# Patient Record
Sex: Male | Born: 1980 | Race: White | Hispanic: No | Marital: Married | Smoking: Never smoker
Health system: Southern US, Community
[De-identification: ages and names within clinical notes are randomized; demographics above are authoritative.]

## PROBLEM LIST (undated history)

## (undated) DIAGNOSIS — T63441A Toxic effect of venom of bees, accidental (unintentional), initial encounter: Secondary | ICD-10-CM

## (undated) HISTORY — PX: NO PAST SURGERIES: SHX2092

## (undated) HISTORY — DX: Toxic effect of venom of bees, accidental (unintentional), initial encounter: T63.441A

---

## 2015-08-21 ENCOUNTER — Encounter (HOSPITAL_BASED_OUTPATIENT_CLINIC_OR_DEPARTMENT_OTHER): Payer: Self-pay | Admitting: *Deleted

## 2015-08-21 ENCOUNTER — Emergency Department (HOSPITAL_BASED_OUTPATIENT_CLINIC_OR_DEPARTMENT_OTHER)
Admission: EM | Admit: 2015-08-21 | Discharge: 2015-08-21 | Disposition: A | Discharge status: Home / Self Care | Payer: Managed Care, Other (non HMO) | Attending: Emergency Medicine | Admitting: Emergency Medicine

## 2015-08-21 ENCOUNTER — Emergency Department (HOSPITAL_BASED_OUTPATIENT_CLINIC_OR_DEPARTMENT_OTHER): Payer: Managed Care, Other (non HMO)

## 2015-08-21 DIAGNOSIS — I10 Essential (primary) hypertension: Secondary | ICD-10-CM

## 2015-08-21 DIAGNOSIS — W270XXA Contact with workbench tool, initial encounter: Secondary | ICD-10-CM | POA: Diagnosis not present

## 2015-08-21 DIAGNOSIS — S61200A Unspecified open wound of right index finger without damage to nail, initial encounter: Secondary | ICD-10-CM | POA: Diagnosis not present

## 2015-08-21 DIAGNOSIS — Y9289 Other specified places as the place of occurrence of the external cause: Secondary | ICD-10-CM | POA: Diagnosis not present

## 2015-08-21 DIAGNOSIS — S6991XA Unspecified injury of right wrist, hand and finger(s), initial encounter: Secondary | ICD-10-CM | POA: Diagnosis present

## 2015-08-21 DIAGNOSIS — Y9389 Activity, other specified: Secondary | ICD-10-CM | POA: Insufficient documentation

## 2015-08-21 DIAGNOSIS — Y998 Other external cause status: Secondary | ICD-10-CM | POA: Diagnosis not present

## 2015-08-21 DIAGNOSIS — T148XXA Other injury of unspecified body region, initial encounter: Secondary | ICD-10-CM

## 2015-08-21 MED ORDER — TRAMADOL HCL 50 MG PO TABS: 50.0000 mg | ORAL_TABLET | Freq: Four times a day (QID) | ORAL | Status: DC | PRN

## 2015-08-21 MED FILL — traMADol HCL 50 MG TABS: 50 | 4 days supply | Qty: 15 | Fill #0

## 2015-08-21 NOTE — ED Notes (Signed)
Per PA's request, patient soaking finger in sterile water, peroxide, & iodine mixture.

## 2015-08-21 NOTE — ED Notes (Signed)
Patient ambulates to and from radiology department. 

## 2015-08-21 NOTE — Discharge Instructions (Signed)
Hypertension Hypertension, commonly called high blood pressure, is when the force of blood pumping through your arteries is too strong. Your arteries are the blood vessels that carry blood from your heart throughout your body. A blood pressure reading consists of a higher number over a lower number, such as 110/72. The higher number (systolic) is the pressure inside your arteries when your heart pumps. The lower number (diastolic) is the pressure inside your arteries when your heart relaxes. Ideally you want your blood pressure below 120/80. Hypertension forces your heart to work harder to pump blood. Your arteries may become narrow or stiff. Having untreated or uncontrolled hypertension can cause heart attack, stroke, kidney disease, and other problems. RISK FACTORS Some risk factors for high blood pressure are controllable. Others are not.  Risk factors you cannot control include:   Race. You may be at higher risk if you are African American.  Age. Risk increases with age.  Gender. Men are at higher risk than women before age 45 years. After age 65, women are at higher risk than men. Risk factors you can control include:  Not getting enough exercise or physical activity.  Being overweight.  Getting too much fat, sugar, calories, or salt in your diet.  Drinking too much alcohol. SIGNS AND SYMPTOMS Hypertension does not usually cause signs or symptoms. Extremely high blood pressure (hypertensive crisis) may cause headache, anxiety, shortness of breath, and nosebleed. DIAGNOSIS To check if you have hypertension, your health care provider will measure your blood pressure while you are seated, with your arm held at the level of your heart. It should be measured at least twice using the same arm. Certain conditions can cause a difference in blood pressure between your right and left arms. A blood pressure reading that is higher than normal on one occasion does not mean that you need treatment. If  it is not clear whether you have high blood pressure, you may be asked to return on a different day to have your blood pressure checked again. Or, you may be asked to monitor your blood pressure at home for 1 or more weeks. TREATMENT Treating high blood pressure includes making lifestyle changes and possibly taking medicine. Living a healthy lifestyle can help lower high blood pressure. You may need to change some of your habits. Lifestyle changes may include:  Following the DASH diet. This diet is high in fruits, vegetables, and whole grains. It is low in salt, red meat, and added sugars.  Keep your sodium intake below 2,300 mg per day.  Getting at least 30-45 minutes of aerobic exercise at least 4 times per week.  Losing weight if necessary.  Not smoking.  Limiting alcoholic beverages.  Learning ways to reduce stress. Your health care provider may prescribe medicine if lifestyle changes are not enough to get your blood pressure under control, and if one of the following is true:  You are 18-59 years of age and your systolic blood pressure is above 140.  You are 60 years of age or older, and your systolic blood pressure is above 150.  Your diastolic blood pressure is above 90.  You have diabetes, and your systolic blood pressure is over 140 or your diastolic blood pressure is over 90.  You have kidney disease and your blood pressure is above 140/90.  You have heart disease and your blood pressure is above 140/90. Your personal target blood pressure may vary depending on your medical conditions, your age, and other factors. HOME CARE INSTRUCTIONS    Have your blood pressure rechecked as directed by your health care provider.   Take medicines only as directed by your health care provider. Follow the directions carefully. Blood pressure medicines must be taken as prescribed. The medicine does not work as well when you skip doses. Skipping doses also puts you at risk for  problems.  Do not smoke.   Monitor your blood pressure at home as directed by your health care provider. SEEK MEDICAL CARE IF:   You think you are having a reaction to medicines taken.  You have recurrent headaches or feel dizzy.  You have swelling in your ankles.  You have trouble with your vision. SEEK IMMEDIATE MEDICAL CARE IF:  You develop a severe headache or confusion.  You have unusual weakness, numbness, or feel faint.  You have severe chest or abdominal pain.  You vomit repeatedly.  You have trouble breathing. MAKE SURE YOU:   Understand these instructions.  Will watch your condition.  Will get help right away if you are not doing well or get worse.   This information is not intended to replace advice given to you by your health care provider. Make sure you discuss any questions you have with your health care provider.   Document Released: 04/19/2005 Document Revised: 09/03/2014 Document Reviewed: 02/09/2013 Elsevier Interactive Patient Education 2016 [place]. Deep Skin Avulsion A deep skin avulsion is a type of open wound. It often results from a severe injury (trauma) that tears away all layers of the skin or an entire body part. The areas of the body that are most often affected by a deep skin avulsion include the face, lips, ears, nose, and fingers. A deep skin avulsion may make structures below the skin become visible. You may be able to see muscle, bone, nerves, and blood vessels. A deep skin avulsion can also damage important structures beneath the skin. These include tendons, ligaments, nerves, or blood vessels. CAUSES Injuries that often cause a deep skin avulsion include:  Being crushed.  Falling against a jagged surface.  Animal bites.  Gunshot wounds.  Severe burns.  Injuries that involve being dragged, such as bicycle or motorcycle accidents. SYMPTOMS Symptoms of a deep skin avulsion include:  Pain.  Numbness.  Swelling.  A  misshapen body part.  Bleeding, which may be heavy.  Fluid leaking from the wound. DIAGNOSIS This condition may be diagnosed with a medical history and physical exam. You may also have X-rays done. TREATMENT The treatment that is chosen for a deep skin avulsion depends on how large and deep the wound is and where it is located. Treatment for all types of avulsions usually starts with:  Controlling the bleeding.  Washing out the wound with a germ-free (sterile) salt-water solution.  Removing dead tissue from the wound. A wound may be closed or left open to heal. This depends on the size and location of the wound and whether it is likely to become infected. Wounds are usually covered or closed if they expose blood vessels, nerves, bone, or cartilage.  Wounds that are small and clean may be closed with stitches (sutures).  Wounds that cannot be closed with sutures may be covered with a piece of skin (graft) or a skin flap. Skin may be taken from on or near the wound, from another part of the body, or from a donor.  Wounds may be left open if they are hard to close or they may become infected. These wounds heal over time from the bottom  up. You may also receive medicine. This may include:  Antibiotics.  A tetanus shot.  Rabies vaccine. HOME CARE INSTRUCTIONS Medicines  Take or apply over-the-counter and prescription medicines only as told by your health care provider.  If you were prescribed an antibiotic, take or apply it as told by your health care provider. Do not stop taking the antibiotic even if your condition improves.  You may get anti-itch medicine while your wound is healing. Use it only as told by your health care provider. Wound Care  There are many ways to close and cover a wound. For example, a wound can be covered with sutures, skin glue, or adhesive strips. Follow instructions from your health care provider about:  How to take care of your wound.  When and how  you should change your bandage (dressing).  When you should remove your dressing.  Removing whatever was used to close your wound.  Keep the dressing dry as told by your health care provider. Do not take baths, swim, use a hot tub, or do anything that would put your wound underwater until your health care provider approves.  Clean the wound each day or as told by your health care provider.  Wash the wound with mild soap and water.  Rinse the wound with water to remove all soap.  Pat the wound dry with a clean towel. Do not rub it.  Do not scratch or pick at the wound.  Check your wound every day for signs of infection. Watch for:  Redness, swelling, or pain.  Fluid, blood, or pus. General Instructions  Raise (elevate) the injured area above the level of your heart while you are sitting or lying down.  Keep all follow-up visits as told by your health care provider. This is important. SEEK MEDICAL CARE IF:  You received a tetanus shot and you have swelling, severe pain, redness, or bleeding at the injection site.  You have a fever.  Your pain is not controlled with medicine.  You have increased redness, swelling, or pain at the site of your wound.  You have fluid, blood, or pus coming from your wound.  You notice a bad smell coming from your wound or your dressing.  A wound that was closed breaks open.  You notice something coming out of the wound, such as wood or glass.  You notice a change in the color of your skin near your wound.  You develop a new rash.  You need to change the dressing frequently due to fluid, blood, or pus draining from the wound. SEEK IMMEDIATE MEDICAL CARE IF:  Your pain suddenly increases and is severe.  You develop severe swelling around the wound.  You develop numbness around the wound.  You have nausea and vomiting that does not go away after 24 hours.  You feel light-headed, weak, or faint.  You develop chest pain.  You have  trouble breathing.  Your wound is on your hand or foot and you cannot properly move a finger or toe.  The wound is on your hand or foot and you notice that your fingers or toes look pale or bluish.  You have a red streak going away from your wound.   This information is not intended to replace advice given to you by your health care provider. Make sure you discuss any questions you have with your health care provider.   Document Released: 06/15/2006 Document Revised: 09/03/2014 Document Reviewed: 04/24/2014 Elsevier Interactive Patient Education [employer].

## 2015-08-21 NOTE — ED Notes (Signed)
Laceration to his right 2nd digit. Bleeding controlled.

## 2015-08-21 NOTE — ED Notes (Signed)
PA at bedside.

## 2015-08-21 NOTE — ED Provider Notes (Signed)
CSN: [id number]     Arrival date & time 08/21/15  1521 History   First MD Initiated Contact with Patient 08/21/15 1558     Chief Complaint  Patient presents with  . Laceration     (Consider location/radiation/quality/duration/timing/severity/associated sxs/prior Treatment) HPI  This is a 35 year old male who presents emergency Department with chief complaint of laceration. Patient states that he was using a skill saw today when he injured his right index finger. He states that he is missing a large area of skin on the index finger. He denies any numbness or tingling. He denies loss of range of motion of the finger. He is up-to-date on his tetanus vaccination.  History reviewed. No pertinent past medical history. History reviewed. No pertinent past surgical history. No family history on file. Social History  Substance Use Topics  . Smoking status: Never Smoker   . Smokeless tobacco: None  . Alcohol Use: Yes     Comment: occ    Review of Systems  Musculoskeletal: Negative for joint swelling.  Skin: Positive for wound.  Neurological: Negative for weakness and numbness.      Allergies  Review of patient's allergies indicates no known allergies.  Home Medications   Prior to Admission medications   Medication Sig Start Date End Date Taking? Authorizing Provider  traMADol (ULTRAM) 50 MG tablet Take 1 tablet (50 mg total) by mouth every 6 (six) hours as needed. 08/21/15   Milano Rosevear, PA-C   BP 146/109 mmHg  Pulse 78  Temp(Src) 98.5 F (36.9 C) (Oral)  Resp 20  Ht 5\' 11"  (1.803 m)  Wt 83.915 kg  BMI 25.81 kg/m2  SpO2 100% Physical Exam  Constitutional: He appears well-developed and well-nourished. No distress.  HENT:  Head: Normocephalic and atraumatic.  Eyes: Conjunctivae are normal. No scleral icterus.  Neck: Normal range of motion. Neck supple.  Cardiovascular: Normal rate, regular rhythm and normal heart sounds.   Pulmonary/Chest: Effort normal and breath  sounds normal. No respiratory distress.  Abdominal: Soft. There is no tenderness.  Musculoskeletal: He exhibits no edema.  Right index finger with skin avulsion injury along the radial side of the right index finger. He has full range of motion at the DIP, PIP and MCP. Neurovascularly intact with cap refill less than 3 seconds.  Neurological: He is alert.  Skin: Skin is warm and dry. He is not diaphoretic.  Psychiatric: His behavior is normal.  Nursing note and vitals reviewed.   ED Course  Procedures (including critical care time) Labs Review Labs Reviewed - No data to display  Imaging Review Dg Finger Index Right  08/21/2015  CLINICAL DATA:  Right index finger laceration today. Initial encounter. EXAM: RIGHT INDEX FINGER 2+V COMPARISON:  None. FINDINGS: Skin defect along the ulnar aspect of the distal right index finger is noted. No underlying fracture, dislocation or radiopaque foreign body. IMPRESSION: Laceration without fracture or foreign body. Electronically Signed   By: Inge Rise M.D.   On: 08/21/2015 15:45   I have personally reviewed and evaluated these images and lab results as part of my medical decision-making.   EKG Interpretation None      MDM   Final diagnoses:  Skin avulsion  Finger injury, right, initial encounter  Essential hypertension   Hypertension noted at today's visit. Follow up with PCP or community health and wellness. The patient with skin avulsion injury of the right finger. At this occurred about an hour and a half prior to my assessment. Negative x-ray. Patient  wound cleansed, bandage and finger splinting applied. There is no tissue to repair the laceration. Discussed home care and return precautions. Her safe for discharge at this time.    Margarita Mail, PA-C 08/21/15 [place], MD 08/21/15 1723

## 2016-06-01 ENCOUNTER — Ambulatory Visit: Payer: Managed Care, Other (non HMO) | Admitting: Physician Assistant

## 2016-06-04 ENCOUNTER — Encounter: Payer: Self-pay | Admitting: Physician Assistant

## 2016-06-04 ENCOUNTER — Ambulatory Visit (INDEPENDENT_AMBULATORY_CARE_PROVIDER_SITE_OTHER): Payer: Managed Care, Other (non HMO) | Admitting: Physician Assistant

## 2016-06-04 VITALS — BP 132/88 | HR 82 | Temp 98.2°F | Resp 14 | Ht 71.75 in | Wt 177.0 lb

## 2016-06-04 DIAGNOSIS — R0982 Postnasal drip: Secondary | ICD-10-CM

## 2016-06-04 MED ORDER — LEVOCETIRIZINE DIHYDROCHLORIDE 5 MG PO TABS: 5.0000 mg | ORAL_TABLET | Freq: Every evening | ORAL | 1 refills | Status: DC

## 2016-06-04 MED ORDER — AZELASTINE HCL 0.1 % NA SOLN: 2.0000 | Freq: Two times a day (BID) | NASAL | 12 refills | Status: DC

## 2016-06-04 MED ORDER — EPINEPHRINE 0.3 MG/0.3ML IJ SOAJ: 0.3000 mg | Freq: Once | INTRAMUSCULAR | 0 refills | Status: AC

## 2016-06-04 NOTE — Patient Instructions (Signed)
Please stay well hydrated. Place a humidifier in the bedroom.  Start saline nasal spray once daily to flush out nasal passages. Use the Astelin spray as directed to help with nasal inflammation and nasal drip. Start the Xyzal daily to help with fluid behind ears and nasal inflammation.  I am hopeful cough will begin to resolve as we get this under control  Follow-up with me in 2 weeks. We will do your complete physical at that time.

## 2016-06-04 NOTE — Progress Notes (Signed)
Pre visit review using our clinic review tool, if applicable. No additional management support is needed unless otherwise documented below in the visit note. 

## 2016-06-04 NOTE — Progress Notes (Signed)
Patient presents to clinic today to establish care. Body mass index is 24.17 kg/m. Endorses well-balanced diet overall. Drinks only water at meal times. Denies regular exercise regimen at present.   Acute Concerns: Patient c/o intermittent dry cough over the past month.  Denies history of asthma. Does have significant history of environmental allergies. Previous shots. No issue wince moving to [place].  Denies fever, chills. Denies nasal congestion.  Denies sinus pressure, sinus pain or ear pain.  No smoke or chemical exposure.  Denies known problem with heart burn, nausea, vomiting or indigestion.   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 Needs refill of Epi-Pen  Health Maintenance: Immunizations -- Declines flu shot. Tetanus up-to-date.   History reviewed. No pertinent past medical history.  History reviewed. No pertinent surgical history.  No current outpatient prescriptions on file prior to visit.   No current facility-administered medications on file prior to visit.     Allergies  Allergen Reactions  . Bee Venom Anaphylaxis    Family History  Problem Relation Age of Onset  . Healthy Mother   . Cancer Father     Small Cell Carcinoma  . Healthy Brother   . Healthy Maternal Grandmother   . Cancer Maternal Grandfather     Kidney  . Dementia Paternal Grandmother   . Diabetes Paternal Grandfather   . Hypertension Paternal Grandfather     Social History   Social History  . Marital status: Married    Spouse name: Mel Almond  . Number of children: 3  . Years of education: 4   Occupational History  . Manager    Social History Main Topics  . Smoking status: Never Smoker  . Smokeless tobacco: Never Used  . Alcohol use 3.6 - 4.8 oz/week    6 - 8 Cans of beer per week  . Drug use: No  . Sexual activity: Yes   Other Topics Concern  . Not on file   Social History Narrative  . No narrative on file   Review of Systems  Constitutional:  Negative for chills, fever and malaise/fatigue.  HENT: Negative for congestion, ear discharge, ear pain, hearing loss, nosebleeds, sinus pain, sore throat and tinnitus.   Eyes: Negative for blurred vision and double vision.  Respiratory: Positive for cough. Negative for hemoptysis, sputum production, shortness of breath, wheezing and stridor.   Cardiovascular: Negative for chest pain and palpitations.  Gastrointestinal: Negative for abdominal pain, blood in stool, constipation, diarrhea, heartburn, melena, nausea and vomiting.  Neurological: Negative for dizziness and loss of consciousness.  Endo/Heme/Allergies: Positive for environmental allergies.   BP 132/88   Pulse 82   Temp 98.2 F (36.8 C) (Oral)   Resp 14   Ht 5' 11.75" (1.822 m)   Wt 177 lb (80.3 kg)   SpO2 99%   BMI 24.17 kg/m   Physical Exam  Constitutional: He is oriented to person, place, and time and well-developed, well-nourished, and in no distress.  HENT:  Head: Normocephalic and atraumatic.  Right Ear: Tympanic membrane is not retracted and not bulging. A middle ear effusion is present.  Left Ear: Tympanic membrane is not retracted and not bulging.  Nose: Mucosal edema and rhinorrhea present. No nasal deformity or septal deviation. Right sinus exhibits no maxillary sinus tenderness and no frontal sinus tenderness. Left sinus exhibits no maxillary sinus tenderness and no frontal sinus tenderness.  Mouth/Throat: Uvula is midline, oropharynx is clear and moist and mucous membranes are normal.  Eyes: Conjunctivae are normal.  Neck: Neck supple.  Cardiovascular: Normal rate, regular rhythm, normal heart sounds and intact distal pulses.   Pulmonary/Chest: Effort normal and breath sounds normal. No respiratory distress. He has no wheezes. He has no rales. He exhibits no tenderness.  Neurological: He is alert and oriented to person, place, and time.  Skin: Skin is warm and dry. No rash noted.  Psychiatric: Affect normal.    Vitals reviewed.  Assessment/Plan: 1. PND (post-nasal drip) As most evident cause of his dry cough. Previously on allergy shots for environmental allergies. Not currently on anything. PND noted on exam associated with nasal turbinate swelling and mild serous effusion behind TM. Will start Astelin and Xyzal. FU 2 weeks.  - levocetirizine (XYZAL) 5 MG tablet; Take 1 tablet (5 mg total) by mouth every evening.  Dispense: 30 tablet; Refill: 1 - azelastine (ASTELIN) 0.1 % nasal spray; Place 2 sprays into both nostrils 2 (two) times daily. Use in each nostril as directed  Dispense: 30 mL; Refill: 12   Raiford Noble [place], [place]

## 2016-06-18 ENCOUNTER — Encounter: Payer: Self-pay | Admitting: Physician Assistant

## 2016-06-18 ENCOUNTER — Ambulatory Visit (INDEPENDENT_AMBULATORY_CARE_PROVIDER_SITE_OTHER): Payer: Managed Care, Other (non HMO) | Admitting: Physician Assistant

## 2016-06-18 VITALS — BP 138/88 | HR 88 | Temp 98.0°F | Resp 14 | Ht 71.75 in | Wt 175.0 lb

## 2016-06-18 DIAGNOSIS — Z0000 Encounter for general adult medical examination without abnormal findings: Secondary | ICD-10-CM

## 2016-06-18 LAB — URINALYSIS, ROUTINE W REFLEX MICROSCOPIC
Bilirubin Urine: NEGATIVE
Hgb urine dipstick: NEGATIVE
Ketones, ur: NEGATIVE
Leukocytes, UA: NEGATIVE
Nitrite: NEGATIVE
RBC / HPF: NONE SEEN (ref 0–?)
SPECIFIC GRAVITY, URINE: 1.015 (ref 1.000–1.030)
Total Protein, Urine: NEGATIVE
URINE GLUCOSE: NEGATIVE
Urobilinogen, UA: 0.2 (ref 0.0–1.0)
WBC UA: NONE SEEN (ref 0–?)
pH: 7 (ref 5.0–8.0)

## 2016-06-18 LAB — COMPREHENSIVE METABOLIC PANEL
ALT: 25 U/L (ref 0–53)
AST: 30 U/L (ref 0–37)
Albumin: 4.6 g/dL (ref 3.5–5.2)
Alkaline Phosphatase: 63 U/L (ref 39–117)
BUN: 8 mg/dL (ref 6–23)
CHLORIDE: 103 meq/L (ref 96–112)
CO2: 27 meq/L (ref 19–32)
CREATININE: 0.86 mg/dL (ref 0.40–1.50)
Calcium: 9.5 mg/dL (ref 8.4–10.5)
GFR: 106.69 mL/min (ref >60.00)
GLUCOSE: 69 mg/dL — AB (ref 70–99)
Potassium: 4.5 mEq/L (ref 3.5–5.1)
Sodium: 141 mEq/L (ref 135–145)
Total Bilirubin: 0.5 mg/dL (ref 0.2–1.2)
Total Protein: 7.5 g/dL (ref 6.0–8.3)

## 2016-06-18 LAB — CBC
HCT: 42.3 % (ref 39.0–52.0)
Hemoglobin: 13.5 g/dL (ref 13.0–17.0)
MCHC: 31.9 g/dL (ref 30.0–36.0)
MCV: 77 fl — AB (ref 78.0–100.0)
Platelets: 184 10*3/uL (ref 150.0–400.0)
RBC: 5.5 Mil/uL (ref 4.22–5.81)
RDW: 19.1 % — AB (ref 11.5–15.5)
WBC: 3.7 10*3/uL — ABNORMAL LOW (ref 4.0–10.5)

## 2016-06-18 LAB — LIPID PANEL
CHOL/HDL RATIO: 3
Cholesterol: 217 mg/dL — ABNORMAL HIGH (ref 0–200)
HDL: 73.2 mg/dL (ref >39.00)
LDL CALC: 113 mg/dL — AB (ref 0–99)
NonHDL: 143.42
TRIGLYCERIDES: 154 mg/dL — AB (ref 0.0–149.0)
VLDL: 30.8 mg/dL (ref 0.0–40.0)

## 2016-06-18 LAB — TSH: TSH: 1.04 u[IU]/mL (ref 0.35–4.50)

## 2016-06-18 NOTE — Patient Instructions (Signed)
Please go to the lab for blood work.   Our office will call you with your results unless you have chosen to receive results via MyChart.  If your blood work is normal we will follow-up each year for physicals and as scheduled for chronic medical problems.  If anything is abnormal we will treat accordingly and get you in for a follow-up.    Preventive Care 18-39 Years, Male Preventive care refers to lifestyle choices and visits with your health care provider that can promote health and wellness. What does preventive care include?  A yearly physical exam. This is also called an annual well check.  Dental exams once or twice a year.  Routine eye exams. Ask your health care provider how often you should have your eyes checked.  Personal lifestyle choices, including:  Daily care of your teeth and gums.  Regular physical activity.  Eating a healthy diet.  Avoiding tobacco and drug use.  Limiting alcohol use.  Practicing safe sex. What happens during an annual well check? The services and screenings done by your health care provider during your annual well check will depend on your age, overall health, lifestyle risk factors, and family history of disease. Counseling  Your health care provider may ask you questions about your:  Alcohol use.  Tobacco use.  Drug use.  Emotional well-being.  Home and relationship well-being.  Sexual activity.  Eating habits.  Work and work environment. Screening  You may have the following tests or measurements:  Height, weight, and BMI.  Blood pressure.  Lipid and cholesterol levels. These may be checked every 5 years starting at age 20.  Diabetes screening. This is done by checking your blood sugar (glucose) after you have not eaten for a while (fasting).  Skin check.  Hepatitis C blood test.  Hepatitis B blood test.  Sexually transmitted disease (STD) testing. Discuss your test results, treatment options, and if  necessary, the need for more tests with your health care provider. Vaccines  Your health care provider may recommend certain vaccines, such as:  Influenza vaccine. This is recommended every year.  Tetanus, diphtheria, and acellular pertussis (Tdap, Td) vaccine. You may need a Td booster every 10 years.  Varicella vaccine. You may need this if you have not been vaccinated.  HPV vaccine. If you are 26 or younger, you may need three doses over 6 months.  Measles, mumps, and rubella (MMR) vaccine. You may need at least one dose of MMR.You may also need a second dose.  Pneumococcal 13-valent conjugate (PCV13) vaccine. You may need this if you have certain conditions and have not been vaccinated.  Pneumococcal polysaccharide (PPSV23) vaccine. You may need one or two doses if you smoke cigarettes or if you have certain conditions.  Meningococcal vaccine. One dose is recommended if you are age 19-21 years and a first-year college student living in a residence hall, or if you have one of several medical conditions. You may also need additional booster doses.  Hepatitis A vaccine. You may need this if you have certain conditions or if you travel or work in places where you may be exposed to hepatitis A.  Hepatitis B vaccine. You may need this if you have certain conditions or if you travel or work in places where you may be exposed to hepatitis B.  Haemophilus influenzae type b (Hib) vaccine. You may need this if you have certain risk factors. Talk to your health care provider about which screenings and vaccines you need   and how often you need them. This information is not intended to replace advice given to you by your health care provider. Make sure you discuss any questions you have with your health care provider. Document Released: 06/15/2001 Document Revised: 01/07/2016 Document Reviewed: 02/18/2015 Elsevier Interactive Patient Education  2017 [employer]  

## 2016-06-18 NOTE — Assessment & Plan Note (Signed)
Depression screen negative. Health Maintenance reviewed -- Tetanus up-to-date. Declines flu and HIV screen. Low risk. Preventive schedule discussed and handout given in AVS. Will obtain fasting labs today.

## 2016-06-18 NOTE — Progress Notes (Signed)
Patient presents to clinic today for annual exam.  Patient is fasting for labs. Body mass index is 23.9 kg/m. Is regularly active -- not sedentary. Endorses a well-balanced diet overall.  Acute Concerns: Denies acute concerns today.    Health Maintenance: Immunizations -- Declines flu shot. Tetanus up-to-date.   Past Medical History:  Diagnosis Date  . 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 NO PAST SURGERIES      Current Outpatient Prescriptions on File Prior to Visit  Medication Sig Dispense Refill  . azelastine (ASTELIN) 0.1 % nasal spray Place 2 sprays into both nostrils 2 (two) times daily. Use in each nostril as directed (Patient not taking: Reported on 06/18/2016) 30 mL 12  . levocetirizine (XYZAL) 5 MG tablet Take 1 tablet (5 mg total) by mouth every evening. (Patient not taking: Reported on 06/18/2016) 30 tablet 1   No current facility-administered medications on file prior to visit.     Allergies  Allergen Reactions  . Bee Venom Anaphylaxis    Family History  Problem Relation Age of Onset  . Healthy Mother   . Cancer Father 25    Small Cell Carcinoma  . Healthy Brother   . Healthy Maternal Grandmother   . Cancer Maternal Grandfather     Kidney  . Dementia Paternal Grandmother   . Diabetes Paternal Grandfather   . Hypertension Paternal Grandfather   . Healthy Child   . Healthy Child   . Healthy Child     Social History   Social History  . Marital status: Married    Spouse name: Mel Almond  . Number of children: 3  . Years of education: 4   Occupational History  . Manager     [employer]   Social History Main Topics  . Smoking status: Never Smoker  . Smokeless tobacco: Never Used  . Alcohol use 3.6 - 4.8 oz/week    6 - 8 Cans of beer per week  . Drug use: No  . Sexual activity: Yes    Partners: Female     Comment: wife has Nexplanon   Other Topics Concern  . Not on file    Social History Narrative  . No narrative on file   Review of Systems  Constitutional: Negative for fever and weight loss.  HENT: Negative for ear discharge, ear pain, hearing loss and tinnitus.   Eyes: Negative for blurred vision, double vision, photophobia and pain.  Respiratory: Negative for cough and shortness of breath.   Cardiovascular: Negative for chest pain and palpitations.  Gastrointestinal: Negative for abdominal pain, blood in stool, constipation, diarrhea, heartburn, melena, nausea and vomiting.  Genitourinary: Negative for dysuria, flank pain, frequency, hematuria and urgency.  Musculoskeletal: Negative for falls.  Neurological: Negative for dizziness, loss of consciousness and headaches.  Endo/Heme/Allergies: Negative for environmental allergies.  Psychiatric/Behavioral: Negative for depression, hallucinations, substance abuse and suicidal ideas. The patient is not nervous/anxious and does not have insomnia.    BP 138/88   Pulse 88   Temp 98 F (36.7 C) (Oral)   Resp 14   Ht 5' 11.75" (1.822 m)   Wt 175 lb (79.4 kg)   SpO2 99%   BMI 23.90 kg/m   Physical Exam  Constitutional: He is oriented to person, place, and time and well-developed, well-nourished, and in no distress.  HENT:  Head: Normocephalic and atraumatic.  Right Ear: External ear normal.  Left Ear: External ear  normal.  Nose: Nose normal.  Mouth/Throat: Oropharynx is clear and moist. No oropharyngeal exudate.  Eyes: Conjunctivae and EOM are normal. Pupils are equal, round, and reactive to light.  Neck: Neck supple. No thyromegaly present.  Cardiovascular: Normal rate, regular rhythm, normal heart sounds and intact distal pulses.   Pulmonary/Chest: Effort normal and breath sounds normal. No respiratory distress. He has no wheezes. He has no rales. He exhibits no tenderness.  Abdominal: Soft. Bowel sounds are normal. He exhibits no distension and no mass. There is no tenderness. There is no rebound  and no guarding.  Genitourinary: Testes/scrotum normal and penis normal. No discharge found.  Lymphadenopathy:    He has no cervical adenopathy.  Neurological: He is alert and oriented to person, place, and time.  Skin: Skin is warm and dry. No rash noted.  Psychiatric: Affect normal.  Vitals reviewed.  Assessment/Plan: Visit for preventive health examination Depression screen negative. Health Maintenance reviewed -- Tetanus up-to-date. Declines flu and HIV screen. Low risk. Preventive schedule discussed and handout given in AVS. Will obtain fasting labs today.     Leeanne Rio, PA-C

## 2016-06-18 NOTE — Progress Notes (Signed)
Pre visit review using our clinic review tool, if applicable. No additional management support is needed unless otherwise documented below in the visit note. 

## 2016-06-21 ENCOUNTER — Other Ambulatory Visit: Payer: Self-pay | Admitting: Physician Assistant

## 2016-06-21 ENCOUNTER — Encounter: Payer: Self-pay | Admitting: Emergency Medicine

## 2016-06-21 DIAGNOSIS — D72819 Decreased white blood cell count, unspecified: Secondary | ICD-10-CM

## 2017-06-21 ENCOUNTER — Other Ambulatory Visit: Payer: Self-pay

## 2017-06-21 ENCOUNTER — Encounter: Payer: Self-pay | Admitting: Physician Assistant

## 2017-06-21 ENCOUNTER — Ambulatory Visit (INDEPENDENT_AMBULATORY_CARE_PROVIDER_SITE_OTHER): Payer: Managed Care, Other (non HMO) | Admitting: Physician Assistant

## 2017-06-21 VITALS — BP 122/86 | HR 79 | Temp 98.2°F | Resp 16 | Ht 71.75 in | Wt 182.0 lb

## 2017-06-21 DIAGNOSIS — H9193 Unspecified hearing loss, bilateral: Secondary | ICD-10-CM | POA: Diagnosis not present

## 2017-06-21 NOTE — Progress Notes (Signed)
Patient presents to clinic today c/o 1 year of gradually worsening hearing loss. Works in a [employer] and has constant loud noise exposure. States he does not wear protective headphones most days at work. Denies ear pressure or popping. Denies tinnitus. Denies ear pain, drainage or history of cerumen impaction. Has family history of early hearing loss.   Past Medical History:  Diagnosis Date  . 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   No current facility-administered medications on file prior to visit.     Allergies  Allergen Reactions  . Bee Venom Anaphylaxis    Family History  Problem Relation Age of Onset  . Healthy Mother   . Cancer Father 82       Small Cell Carcinoma  . Healthy Brother   . Healthy Maternal Grandmother   . Cancer Maternal Grandfather        Kidney  . Dementia Paternal Grandmother   . Diabetes Paternal Grandfather   . Hypertension Paternal Grandfather   . Healthy Child   . Healthy Child   . Healthy Child     Social History   Socioeconomic History  . Marital status: Married    Spouse name: Mel Almond  . Number of children: 3  . Years of education: 4  . Highest education level: None  Social Needs  . Financial resource strain: None  . Food insecurity - worry: None  . Food insecurity - inability: None  . Transportation needs - medical: None  . Transportation needs - non-medical: None  Occupational History  . Occupation: Freight forwarder    Comment: [employer]  Tobacco Use  . Smoking status: Never Smoker  . Smokeless tobacco: Never Used  Substance and Sexual Activity  . Alcohol use: Yes    Alcohol/week: 3.6 - 4.8 oz    Types: 6 - 8 Cans of beer per week  . Drug use: No  . Sexual activity: Yes    Partners: Female    Comment: wife has Nexplanon  Other Topics Concern  . None  Social History Narrative  . None   Review of Systems - See HPI.  All other ROS are negative.  BP 122/86    Pulse 79   Temp 98.2 F (36.8 C) (Oral)   Resp 16   Ht 5' 11.75" (1.822 m)   Wt 182 lb (82.6 kg)   SpO2 98%   BMI 24.86 kg/m   Physical Exam  Constitutional: He is oriented to person, place, and time and well-developed, well-nourished, and in no distress.  HENT:  Head: Normocephalic and atraumatic.  Right Ear: External ear normal.  Left Ear: External ear normal.  Nose: Nose normal.  Mouth/Throat: Oropharynx is clear and moist. No oropharyngeal exudate.  L TM within normal limits. R TM with area of tympanosclerosis from prior ear infection noted. Otherwise within normal limits.  Eyes: Conjunctivae are normal.  Cardiovascular: Normal rate, regular rhythm, normal heart sounds and intact distal pulses.  Pulmonary/Chest: Effort normal and breath sounds normal. No respiratory distress. He has no wheezes. He has no rales. He exhibits no tenderness.  Neurological: He is alert and oriented to person, place, and time.  Skin: Skin is warm and dry. No rash noted.  Psychiatric: Affect normal.  Vitals reviewed.  Assessment/Plan: 1. Bilateral hearing loss, unspecified hearing loss type Hearing screen done in office. Patient can only hear 4000 hz with R ear. Failed other frequencies bilaterally. Referral  to Audiology for in depth testing and management placed. Reminded him to always wear protective eye wear and head phones while at work.   - Ambulatory referral to Audiology   Leeanne Rio, PA-C

## 2017-06-21 NOTE — Patient Instructions (Signed)
Please keep your phone on. You will be contacted for an in-depth hearing assessment by Audiology. This will get to the bottom of the hearing loss you have noted.  They will send me a copy of the testing results and notes so that I will know what all they are doing for you.

## 2017-08-17 ENCOUNTER — Ambulatory Visit (INDEPENDENT_AMBULATORY_CARE_PROVIDER_SITE_OTHER): Payer: Managed Care, Other (non HMO) | Admitting: Physician Assistant

## 2017-08-17 ENCOUNTER — Other Ambulatory Visit: Payer: Self-pay

## 2017-08-17 ENCOUNTER — Telehealth: Payer: Self-pay | Admitting: Physician Assistant

## 2017-08-17 ENCOUNTER — Encounter: Payer: Self-pay | Admitting: Physician Assistant

## 2017-08-17 ENCOUNTER — Telehealth: Payer: Self-pay | Admitting: General Practice

## 2017-08-17 VITALS — BP 120/86 | HR 89 | Temp 98.2°F | Resp 16 | Ht 71.75 in | Wt 183.0 lb

## 2017-08-17 DIAGNOSIS — Z23 Encounter for immunization: Secondary | ICD-10-CM | POA: Diagnosis not present

## 2017-08-17 DIAGNOSIS — Z7189 Other specified counseling: Secondary | ICD-10-CM | POA: Diagnosis not present

## 2017-08-17 DIAGNOSIS — Z7184 Encounter for health counseling related to travel: Secondary | ICD-10-CM

## 2017-08-17 MED ORDER — TYPHOID VACCINE PO CPDR: 1.0000 | DELAYED_RELEASE_CAPSULE | ORAL | 0 refills | Status: DC

## 2017-08-17 MED ORDER — CIPROFLOXACIN HCL 500 MG PO TABS: 500.0000 mg | ORAL_TABLET | Freq: Two times a day (BID) | ORAL | 0 refills | Status: DC

## 2017-08-17 NOTE — Telephone Encounter (Signed)
Spoke with patient  - he is aware of the notes below and would like to stick with the Cipro.  He will pick this up from the pharmacy.

## 2017-08-17 NOTE — Progress Notes (Signed)
Patient presents to clinic today to discuss travel vaccines. Will be traveling to [place], [place] next week for 3 days on business. Will be staying in a hotel/resort area. No travel to rural regions. Patient endorses being up-to-date on all of his childhood immunizations. Does have history of traveler's diarrhea so he known what to try and avoid.   Past Medical History:  Diagnosis Date  . 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   No current facility-administered medications on file prior to visit.     Allergies  Allergen Reactions  . Bee Venom Anaphylaxis    Family History  Problem Relation Age of Onset  . Healthy Mother   . Cancer Father 31       Small Cell Carcinoma  . Healthy Brother   . Healthy Maternal Grandmother   . Cancer Maternal Grandfather        Kidney  . Dementia Paternal Grandmother   . Diabetes Paternal Grandfather   . Hypertension Paternal Grandfather   . Healthy Child   . Healthy Child   . Healthy Child     Social History   Socioeconomic History  . Marital status: Married    Spouse name: Mel Almond  . Number of children: 3  . Years of education: 4  . Highest education level: Not on file  Occupational History  . Occupation: Freight forwarder    Comment: [employer]  . Financial resource strain: Not on file  . Food insecurity:    Worry: Not on file    Inability: Not on file  . Transportation needs:    Medical: Not on file    Non-medical: Not on file  Tobacco Use  . Smoking status: Never Smoker  . Smokeless tobacco: Never Used  Substance and Sexual Activity  . Alcohol use: Yes    Alcohol/week: 3.6 - 4.8 oz    Types: 6 - 8 Cans of beer per week  . Drug use: No  . Sexual activity: Yes    Partners: Female    Comment: wife has Nexplanon  Lifestyle  . Physical activity:    Days per week: Not on file    Minutes per session: Not on file  . Stress: Not on file    Relationships  . Social connections:    Talks on phone: Not on file    Gets together: Not on file    Attends religious service: Not on file    Active member of club or organization: Not on file    Attends meetings of clubs or organizations: Not on file    Relationship status: Not on file  Other Topics Concern  . Not on file  Social History Narrative  . Not on file   Review of Systems - See HPI.  All other ROS are negative.  BP 120/86   Pulse 89   Temp 98.2 F (36.8 C) (Oral)   Resp 16   Ht 5' 11.75" (1.822 m)   Wt 183 lb (83 kg)   SpO2 98%   BMI 24.99 kg/m   Physical Exam  Constitutional: He is oriented to person, place, and time.  Eyes: Conjunctivae are normal.  Cardiovascular: Normal rate, regular rhythm and normal heart sounds.  Pulmonary/Chest: Effort normal.  Neurological: He is alert and oriented to person, place, and time.  Skin: Skin is warm.   Assessment/Plan: 1. Travel advice encounter Needs Oral Typhoid and Hep A.  Hep A given in office. Rx for Vivotif sent to pharmacy to take as directed. Recommended he review the CDC traveler's health page.  - typhoid (VIVOTIF) DR capsule; Take 1 capsule by mouth every other day.  Dispense: 4 capsule; Refill: 0  2. Need for hepatitis A vaccination Hep A updated today.  - Hepatitis A vaccine adult IM   Leeanne Rio, PA-C

## 2017-08-17 NOTE — Patient Instructions (Signed)
Please start the Typhoid vaccine today and take as directed. The Hep A vaccine was updated today.  This should cover you for your trip since you endorse having all of your regular vaccines.   If travel plans change in the future (extended stays or travel into rural areas, please let me know)

## 2017-08-17 NOTE — Telephone Encounter (Signed)
The cipro is to take in case of traveler's diarrhea which is what I felt he was requesting from what I was told.  If he is wanting something different for generalized diarrhea then I can send in a prescription for loperamide to use.  If he avoids fruits/vegetables/rural water sources he should not have any issue.

## 2017-08-17 NOTE — Telephone Encounter (Signed)
PCP does not have any openings. We do have the Hep A in the office. Can schedule with another provider.   Copied from [place]. Topic: General - Other >> Aug 17, 2017  9:42 AM Oneta Rack wrote: Caller name: Hoyt,Bailey Relation to pt: spouse  Call back number: [phone]   Reason for call:    spouse calling on patient behalf seeking to schedule appointment this afternoon with PCP due to a last minute business trip traveling to [place] next week. Please advise if office carries the following vaccination: hep A, Typhoid fever and Malaria.

## 2017-08-17 NOTE — Telephone Encounter (Signed)
Copied from [place] [id number]. Topic: Quick Communication - See Telephone Encounter >> Aug 17, 2017  4:33 PM Rutherford Nail, NT wrote: CRM for notification. See Telephone encounter for: 08/17/17. Patient calling and states that he was prescribed something at his appointment today for diarrhea. Patient does not currently have diarrhea, but will be traveling out of the country([place]) on Tuesday. States he has been told that it is likely he will get diarrhea while in [place]. Went to pharmacy and was prescribed a ciprofloxacin (CIPRO) 500 MG tablet. Patient is wondering why an antibiotic was prescribed instead of something stronger than OTC antidiarrheal.  States he would like a call back. CB# [phone].

## 2017-09-27 ENCOUNTER — Encounter: Payer: Self-pay | Admitting: Physician Assistant

## 2017-09-27 ENCOUNTER — Ambulatory Visit (INDEPENDENT_AMBULATORY_CARE_PROVIDER_SITE_OTHER): Payer: Managed Care, Other (non HMO) | Admitting: Physician Assistant

## 2017-09-27 ENCOUNTER — Other Ambulatory Visit: Payer: Self-pay

## 2017-09-27 VITALS — BP 140/92 | HR 81 | Temp 98.3°F | Resp 16 | Ht 71.75 in | Wt 185.0 lb

## 2017-09-27 DIAGNOSIS — S70261A Insect bite (nonvenomous), right hip, initial encounter: Secondary | ICD-10-CM

## 2017-09-27 DIAGNOSIS — W57XXXA Bitten or stung by nonvenomous insect and other nonvenomous arthropods, initial encounter: Secondary | ICD-10-CM | POA: Diagnosis not present

## 2017-09-27 MED ORDER — DOXYCYCLINE HYCLATE 100 MG PO CAPS: 100.0000 mg | ORAL_CAPSULE | Freq: Two times a day (BID) | ORAL | 0 refills | Status: DC

## 2017-09-27 NOTE — Progress Notes (Signed)
Patient presents to clinic today c/o pruritic and red area surrounding a tick bite of R hip. Patient was bitten Saturday while in the woods. Removed tick later that evening without issue. Denies fever, chills, malaise. Has noted some increased redness, puffiness and itching of the surrounding skin. Denies using anything for symptoms.   Past Medical History:  Diagnosis Date  . 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   No current facility-administered medications on file prior to visit.     Allergies  Allergen Reactions  . Bee Venom Anaphylaxis    Family History  Problem Relation Age of Onset  . Healthy Mother   . Cancer Father 9       Small Cell Carcinoma  . Healthy Brother   . Healthy Maternal Grandmother   . Cancer Maternal Grandfather        Kidney  . Dementia Paternal Grandmother   . Diabetes Paternal Grandfather   . Hypertension Paternal Grandfather   . Healthy Child   . Healthy Child   . Healthy Child     Social History   Socioeconomic History  . Marital status: Married    Spouse name: Mel Almond  . Number of children: 3  . Years of education: 4  . Highest education level: Not on file  Occupational History  . Occupation: Freight forwarder    Comment: [employer]  . Financial resource strain: Not on file  . Food insecurity:    Worry: Not on file    Inability: Not on file  . Transportation needs:    Medical: Not on file    Non-medical: Not on file  Tobacco Use  . Smoking status: Never Smoker  . Smokeless tobacco: Never Used  Substance and Sexual Activity  . Alcohol use: Yes    Alcohol/week: 3.6 - 4.8 oz    Types: 6 - 8 Cans of beer per week  . Drug use: No  . Sexual activity: Yes    Partners: Female    Comment: wife has Nexplanon  Lifestyle  . Physical activity:    Days per week: Not on file    Minutes per session: Not on file  . Stress: Not on file  Relationships  . Social  connections:    Talks on phone: Not on file    Gets together: Not on file    Attends religious service: Not on file    Active member of club or organization: Not on file    Attends meetings of clubs or organizations: Not on file    Relationship status: Not on file  Other Topics Concern  . Not on file  Social History Narrative  . Not on file   Review of Systems - See HPI.  All other ROS are negative.  BP (!) 140/92   Pulse 81   Temp 98.3 F (36.8 C) (Oral)   Resp 16   Ht 5' 11.75" (1.822 m)   Wt 185 lb (83.9 kg)   SpO2 99%   BMI 25.27 kg/m   Physical Exam  Constitutional: He appears well-developed and well-nourished.  Cardiovascular: Normal rate, regular rhythm and normal heart sounds.  Pulmonary/Chest: Effort normal.  Neurological: He is alert.  Skin:     Vitals reviewed.  Assessment/Plan: 1. Tick bite, initial encounter With cellulitis and local allergic reaction. Tick attaches for a couple of hours. Cold compresses and antihistamines recommended. Start Doxycycline 100 mg BID x  10 days. Alarm signs/symptoms reviewed with patient that would prompt return.   Leeanne Rio, PA-C

## 2017-09-27 NOTE — Patient Instructions (Signed)
Please take the Doxycycline as directed for 10 days. Keep hydrated.  Apply cold compresses/ice pack to the are to help with localized inflammation. Take a Claritin in the day and a Benadryl at bedtime to help with itch.  The local allergic reaction should calm down in a couple of days.  There is sign of the start of a skin infection and the area of bite. The doxycycline will take care of this.  If you develop fever, chills, nausea or new-onset rash, come see me for reassessment.    Tick Bite Information, Adult Ticks are insects that can bite. Most ticks live in shrubs and grassy areas. They climb onto people and animals that go by. Then they bite. Some ticks carry germs that can make you sick. How can I prevent tick bites?  Use an insect repellent that has 20% or higher of the ingredients DEET, picaridin, or IR3535. Put this insect repellent on: ? Bare skin. ? The tops of your boots. ? Your pant legs. ? The ends of your sleeves.  If you use an insect repellent that has the ingredient permethrin, make sure to follow the instructions on the bottle. Treat the following: ? Clothing. ? Supplies. ? Boots. ? Tents.  Wear long sleeves, long pants, and light colors.  Tuck your pant legs into your socks.  Stay in the middle of the trail.  Try not to walk through long grass.  Before going inside your house, check your clothes, hair, and skin for ticks. Make sure to check your head, neck, armpits, waist, groin, and joint areas.  Check for ticks every day.  When you come indoors: ? Wash your clothes right away. ? Shower right away. ? Dry your clothes in a dryer on high heat for 60 minutes or more. What is the right way to remove a tick? Remove a tick from your skin as soon as possible.  To remove a tick that is crawling on your skin: ? Go outdoors and brush the tick off. ? Use tape or a lint roller.  To remove a tick that is biting: ? Wash your hands. ? If you have latex gloves,  put them on. ? Use tweezers, curved forceps, or a tick-removal tool to grasp the tick. Grasp the tick as close to your skin and as close to the tick's head as possible. ? Gently pull up until the tick lets go.  Try to keep the tick's head attached to its body.  Do not twist or jerk the tick.  Do not squeeze or crush the tick.  Do not try to remove a tick with heat, alcohol, petroleum jelly, or fingernail polish. How should I get rid of a tick? Here are some ways to get rid of a tick that is alive:  Place the tick in rubbing alcohol.  Place the tick in a bag or container you can close tightly.  Wrap the tick tightly in tape.  Flush the tick down the toilet.  Contact a doctor if:  You have symptoms of a disease, such as: ? Pain in a muscle, joint, or bone. ? Trouble walking or moving your legs. ? Numbness in your legs. ? Inability to move (paralysis). ? A red rash that makes a circle (bull's-eye rash). ? Redness and swelling where the tick bit you. ? A fever. ? Throwing up (vomiting) over and over. ? Diarrhea. ? Weight loss. ? Tender and swollen lymph glands. ? Shortness of breath. ? Cough. ? Belly pain (abdominal  pain). ? Headache. ? Being more tired than normal. ? A change in how alert (conscious) you are. ? Confusion. Get help right away if:  You cannot remove a tick.  A part of a tick breaks off and gets stuck in your skin.  You are feeling worse. Summary  Ticks may carry germs that can make you sick.  To prevent tick bites, wear long sleeves, long pants, and light colors. Use insect repellent. Follow the instructions on the bottle.  If the tick is biting, do not try to remove it with heat, alcohol, petroleum jelly, or fingernail polish.  Use tweezers, curved forceps, or a tick-removal tool to grasp the tick. Gently pull up until the tick lets go. Do not twist or jerk the tick. Do not squeeze or crush the tick.  If you have symptoms, contact a  doctor. This information is not intended to replace advice given to you by your health care provider. Make sure you discuss any questions you have with your health care provider. Document Released: 07/14/2009 Document Revised: 07/30/2016 Document Reviewed: 07/30/2016 Elsevier Interactive Patient Education  2018 [employer].

## 2017-10-12 ENCOUNTER — Ambulatory Visit: Payer: Managed Care, Other (non HMO) | Admitting: Audiology

## 2017-11-30 ENCOUNTER — Other Ambulatory Visit: Payer: Self-pay | Admitting: Physician Assistant

## 2017-11-30 ENCOUNTER — Ambulatory Visit: Payer: Managed Care, Other (non HMO) | Attending: Physician Assistant | Admitting: Audiology

## 2017-11-30 DIAGNOSIS — R94128 Abnormal results of other function studies of ear and other special senses: Secondary | ICD-10-CM | POA: Diagnosis present

## 2017-11-30 DIAGNOSIS — IMO0001 Reserved for inherently not codable concepts without codable children: Secondary | ICD-10-CM

## 2017-11-30 DIAGNOSIS — H903 Sensorineural hearing loss, bilateral: Secondary | ICD-10-CM

## 2017-11-30 DIAGNOSIS — H93299 Other abnormal auditory perceptions, unspecified ear: Secondary | ICD-10-CM | POA: Insufficient documentation

## 2017-11-30 DIAGNOSIS — Z01118 Encounter for examination of ears and hearing with other abnormal findings: Secondary | ICD-10-CM | POA: Insufficient documentation

## 2017-11-30 DIAGNOSIS — H918X3 Other specified hearing loss, bilateral: Secondary | ICD-10-CM | POA: Diagnosis present

## 2017-11-30 NOTE — Progress Notes (Signed)
Urgent referral placed. OV notes given to angela at front desk to fax.

## 2017-11-30 NOTE — Procedures (Signed)
Outpatient Audiology and [place]  [place], [place] [address]  [phone]  Audiological Evaluation  Patient Name: Randy Brooks    Status: Outpatient   DOB: 1981-03-09    Diagnosis: Hearing Loss           MRN: [id number] Date:  11/30/2017     Referent: Brunetta Jeans, PA-C  History: Randy Brooks was seen for an audiological evaluation. He has noticed a change in hearing over the past one year. He works in a position that requires extensive travel with daily meetings.  Primary Concern:  Pain: None History of hearing problems: Y - for about "one year".  Wife is noticing I can't hear, trouble hearing in business meeting. Turning TV up more. History of ear infections:  Y  History of ear surgery or "tubes" : Y - "once as a kid" History of dizziness/vertigo: N History of balance issues:  N Tinnitus: Y - occasional high pitched tone. 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 Although maternal grandfather may have started wearing hearing aids in his 27's.    Evaluation: Conventional pure tone audiometry from 250Hz  - 8000Hz  with using insert earphones.  Hearing Thresholds show a predominantly sensorineural hearing loss bilaterally with a minimal conductive component on the left side at some frequencies. Right ear:  Thresholds of 30-35 dBHL from 250Hz  - 500Hz ; 40-45 dBHL from 750Hz  - 8000Hz  except for 55 dB: at 4000Hz  only.  Left ear:    Thresholds of 40-50 dBHL from 250Hz  - 8000Hz  except for 55dBHL hearing thresholds a 1000Hz  and 4000Hz . Reliability is good Speech reception levels (repeating words near threshold) using recorded spondee word lists:  Right ear: 35 dBHL.  Left ear:  40 dBHL Word recognition (at comfortably loud volumes) using recorded NU-6 word lists, in quiet.  Right ear: 96%.   Left ear:   76% Word recognition in minimal background noise:  +5 dBHL  Right ear: 64%                              Left ear:  60%  Tympanometry shows normal middle ear volume pressure and compliance bilaterally (Type A). Ipsilateral acoustic reflexes are 85dB on the right and 80 dB on the left from 500Hz  - 4000Hz .   Distortion Product Otoacoustic Emissions (DPOAE's), a test of inner ear function was completed from 2000Hz  - 10,000Hz  bilaterally:  Right ear: Responses vary from present to weak and abnormal throughout the range marginal outer hair cell function in the cochlea.  Left ear:  Absent and abnormal responses throughout the range supporting abnormal outer hair cell function in the cochlea.  CONCLUSION:      Randy Brooks needs referral to an ENT now because he reports that this significant hearing loss present today, has recently developed over the past year. Randy Brooks has bilateral hearing loss that will adversely affect his ability to hearing a normal conversational speech levels.  He has a mild to moderate hearing loss on the left side and on the right side a mild low frequency improving to normal hearing in the high frequencies on the right side. The hearing loss is primarily sensorineural bilaterally with a mixed component at a few frequencies on the left side.  Word recognition is excellent in the right ear but is  only fair on the left ear in quiet at loud conversational speech levels bilaterally. In minimal background noise, word recognition drops to poor in each ear. There is no sign of retrocochlear issues in either ear with present ipsilateral acoustic reflexes and negative acoustic reflex decay bilaterally.  Randy Brooks needs a hearing aid evaluation. Amplification helps make the signal louder and therefore often improves hearing and word recognition. Amplification has many forms including hearing aids in one or both ears, an assistive listening device which have a microphone and speaker such  as a small handheld device and/or even a surround sound system of speakers.  Amplification may be covered by some insurances, but not all.  It is important to note that hearing aids must be individually fit according to the hearing test results and the ear shape.  Audiologists and hearing aid dealers in [place] must be licensed in order to dispense hearing aids.  In addition, a trial period is mandated by law in our state because often amplification must be tried and then evaluated in order to determine benefit. There are many excellent choices when it comes to amplification in our area and providers are listed in the phone book under hearing aids, there are audiologists in private practice, those affiliated with Ear, Nose and Throat physicians, and there are audiologists located at [employer].  The test results were discussed and Randy Brooks counseled.   RECOMMENDATIONS: 1.   Referral to an ENT because a) hearing loss that reportedly developed over the past year  B) asymmetrical hearing loss poorer on the left side  C) poorer word recognition in quiet on the left side.  2.   Closely monitor hearing to rule out progressive hearing loss with a repeat audiological evaluation every 3 months until hearing stability is ensured. This evaluation may be competed here or at the ENT. The ENT is preferable.   3.   A hearing aid evaluation. Please check with insurance company to determine hearing aid benefit. Another option, once medical clearance from the ENT is obtained is to contact Vocational Rehabilitation to assistance with hearing aid purchase (Vocational Rehab Tel: [phone]).    4.  Strategies that help improve hearing include: A) Face the speaker directly. Optimal is having the speakers face well - lit.  Unless amplified, being within 3-6 feet of the speaker will enhance word recognition. B) Avoid having the speaker back-lit as this will minimize the ability to use cues from  lip-reading, facial expression and gestures. C)  Word recognition is poorer in background noise. For optimal word recognition, turn off the TV, radio or noisy fan when engaging in conversation. In a restaurant, try to sit away from noise sources and close to the primary speaker.  D)  Ask for topic clarification from time to time in order to remain in the conversation.  Most people don't mind repeating or clarifying a point when asked.  If needed, explain the difficulty hearing in background noise or hearing loss.  5.   Use hearing protection during noisy activities such as using a weed eater, moving the lawn, shooting, etc.    Musician's plugs, are available from [employer] for music related hearing protection because there is no distortion.  Other hearing protection, such as sponge plugs (available at pharmacies) or earmuffs (available at sporting goods stores or department stores such as Paediatric nurse) are useful for noisy activities and venues.   Sidra Oldfield L. Heide Spark, Au.D., CCC-A Doctor of Audiology 11/30/2017   cc: Brunetta Jeans,  PA-C

## 2017-12-21 ENCOUNTER — Other Ambulatory Visit: Payer: Self-pay

## 2017-12-21 ENCOUNTER — Ambulatory Visit (INDEPENDENT_AMBULATORY_CARE_PROVIDER_SITE_OTHER): Payer: Managed Care, Other (non HMO) | Admitting: Physician Assistant

## 2017-12-21 ENCOUNTER — Encounter: Payer: Self-pay | Admitting: Physician Assistant

## 2017-12-21 VITALS — BP 110/82 | HR 74 | Temp 98.0°F | Resp 14 | Ht 71.75 in | Wt 178.0 lb

## 2017-12-21 DIAGNOSIS — H1031 Unspecified acute conjunctivitis, right eye: Secondary | ICD-10-CM

## 2017-12-21 MED ORDER — POLYMYXIN B-TRIMETHOPRIM 10000-0.1 UNIT/ML-% OP SOLN: OPHTHALMIC | 0 refills | Status: DC

## 2017-12-21 NOTE — Progress Notes (Signed)
Patient presents to clinic today c/o irritation, watering and drainage from R eye. Notes mild irritation yesterday, thinking he had an eyelash in his eye. Notes rubbing his eye throughout the day. This morning on waking, he noted significant irritation, redness and drainage. Some crusting but minor. Took his contacts out at that time. Notes mild photophobia without vision changes. Denies fever, chills. Denies metal working, yard work, trauma to the eye. Denies symptoms of L eye.   Past Medical History:  Diagnosis Date  . 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   No current facility-administered medications on file prior to visit.     Allergies  Allergen Reactions  . Bee Venom Anaphylaxis    Family History  Problem Relation Age of Onset  . Healthy Mother   . Cancer Father 39       Small Cell Carcinoma  . Healthy Brother   . Healthy Maternal Grandmother   . Cancer Maternal Grandfather        Kidney  . Dementia Paternal Grandmother   . Diabetes Paternal Grandfather   . Hypertension Paternal Grandfather   . Healthy Child   . Healthy Child   . Healthy Child     Social History   Socioeconomic History  . Marital status: Married    Spouse name: Mel Almond  . Number of children: 3  . Years of education: 4  . Highest education level: Not on file  Occupational History  . Occupation: Freight forwarder    Comment: [employer]  . Financial resource strain: Not on file  . Food insecurity:    Worry: Not on file    Inability: Not on file  . Transportation needs:    Medical: Not on file    Non-medical: Not on file  Tobacco Use  . Smoking status: Never Smoker  . Smokeless tobacco: Never Used  Substance and Sexual Activity  . Alcohol use: Yes    Alcohol/week: 6.0 - 8.0 standard drinks    Types: 6 - 8 Cans of beer per week  . Drug use: No  . Sexual activity: Yes    Partners: Female    Comment: wife has  Nexplanon  Lifestyle  . Physical activity:    Days per week: Not on file    Minutes per session: Not on file  . Stress: Not on file  Relationships  . Social connections:    Talks on phone: Not on file    Gets together: Not on file    Attends religious service: Not on file    Active member of club or organization: Not on file    Attends meetings of clubs or organizations: Not on file    Relationship status: Not on file  Other Topics Concern  . Not on file  Social History Narrative  . Not on file   Review of Systems - See HPI.  All other ROS are negative.  BP 110/82   Pulse 74   Temp 98 F (36.7 C) (Oral)   Resp 14   Ht 5' 11.75" (1.822 m)   Wt 178 lb (80.7 kg)   SpO2 99%   BMI 24.31 kg/m   Physical Exam  Constitutional: He appears well-developed and well-nourished.  HENT:  Head: Normocephalic and atraumatic.  Eyes: Pupils are equal, round, and reactive to light. EOM and lids are normal. Lids are everted and swept, no foreign bodies found. Right eye exhibits  discharge. Right eye exhibits no hordeolum. No foreign body present in the right eye. Left eye exhibits no discharge and no hordeolum. No foreign body present in the left eye. Right conjunctiva is injected. Right conjunctiva has no hemorrhage. Left conjunctiva is not injected. Left conjunctiva has no hemorrhage. No scleral icterus.  Cardiovascular: Normal rate, regular rhythm, normal heart sounds and intact distal pulses.  Pulmonary/Chest: Effort normal.  Psychiatric: He has a normal mood and affect.  Vitals reviewed.  Assessment/Plan: 1. Acute bacterial conjunctivitis of right eye Supportive measures reviewed. Start Polytrim OP as directed. No contacts until symptoms are resolved.  - trimethoprim-polymyxin b (POLYTRIM) ophthalmic solution; Apply 1-2 drops into eye QID x 5 days.  Dispense: 10 mL; Refill: 0   Leeanne Rio, PA-C

## 2017-12-21 NOTE — Patient Instructions (Signed)
Please keep contacts out until symptoms resolve. Please throw the R contact away that you took out this morning. Apply warm compresses to the eye to promote drainage. Avoid touching the eye. Use the antibiotic eye drop as directed. If not improving within 48 hours or you note any acute worsening of symptoms, you need ER assessment or to call me so I can send you to an eye specialist.    Bacterial Conjunctivitis Bacterial conjunctivitis is an infection of your conjunctiva. This is the clear membrane that covers the white part of your eye and the inner surface of your eyelid. This condition can make your eye:  Red or pink.  Itchy.  This condition is caused by bacteria. This condition spreads very easily from person to person (is contagious) and from one eye to the other eye. Follow these instructions at home: Medicines  Take or apply your antibiotic medicine as told by your doctor. Do not stop taking or applying the antibiotic even if you start to feel better.  Take or apply over-the-counter and prescription medicines only as told by your doctor.  Do not touch your eyelid with the eye drop bottle or the ointment tube. Managing discomfort  Wipe any fluid from your eye with a warm, wet washcloth or a cotton ball.  Place a cool, clean washcloth on your eye. Do this for 10-20 minutes, 3-4 times per day. General instructions  Do not wear contact lenses until the irritation is gone. Wear glasses until your doctor says it is okay to wear contacts.  Do not wear eye makeup until your symptoms are gone. Throw away any old makeup.  Change or wash your pillowcase every day.  Do not share towels or washcloths with anyone.  Wash your hands often with soap and water. Use paper towels to dry your hands.  Do not touch or rub your eyes.  Do not drive or use heavy machinery if your vision is blurry. Contact a doctor if:  You have a fever.  Your symptoms do not get better after 10 days. Get  help right away if:  You have a fever and your symptoms suddenly get worse.  You have very bad pain when you move your eye.  Your face: ? Hurts. ? Is red. ? Is swollen.  You have sudden loss of vision. This information is not intended to replace advice given to you by your health care provider. Make sure you discuss any questions you have with your health care provider. Document Released: 01/27/2008 Document Revised: 09/25/2015 Document Reviewed: 01/30/2015 Elsevier Interactive Patient Education  [employer].

## 2017-12-28 ENCOUNTER — Ambulatory Visit: Payer: Managed Care, Other (non HMO) | Admitting: Audiology

## 2018-07-28 ENCOUNTER — Telehealth: Payer: Self-pay | Admitting: Physician Assistant

## 2018-07-28 ENCOUNTER — Other Ambulatory Visit: Payer: Self-pay

## 2018-07-28 ENCOUNTER — Encounter: Payer: Self-pay | Admitting: Physician Assistant

## 2018-07-28 ENCOUNTER — Ambulatory Visit (INDEPENDENT_AMBULATORY_CARE_PROVIDER_SITE_OTHER): Payer: Managed Care, Other (non HMO) | Admitting: Physician Assistant

## 2018-07-28 VITALS — Temp 97.9°F

## 2018-07-28 DIAGNOSIS — J029 Acute pharyngitis, unspecified: Secondary | ICD-10-CM

## 2018-07-28 DIAGNOSIS — J310 Chronic rhinitis: Secondary | ICD-10-CM

## 2018-07-28 MED ORDER — BENZONATATE 200 MG PO CAPS: 200.0000 mg | ORAL_CAPSULE | Freq: Three times a day (TID) | ORAL | 0 refills | Status: DC | PRN

## 2018-07-28 MED ORDER — AMOXICILLIN 875 MG PO TABS: 875.0000 mg | ORAL_TABLET | Freq: Two times a day (BID) | ORAL | 0 refills | Status: DC

## 2018-07-28 MED ORDER — FLUTICASONE PROPIONATE 50 MCG/ACT NA SUSP: 2.0000 | Freq: Every day | NASAL | 0 refills | Status: DC

## 2018-07-28 NOTE — Patient Instructions (Addendum)
Please keep hydrated and get plenty of rest.  Restart Allegra and take daily over the rest of the week. Start a daily saline nasal rinse. Take/Use the Flonase after this each morning.  The Tessalon is to help with cough. Salt-water gargles and Chloraseptic spray can help with throat pain. Alternate Tylenol and Ibuprofen if needed for the pain.  Hold off on the antibiotic.  If symptoms are not easing up over the next 48 hours with the above measures, or anything acutely worsens, please start the antibiotic and notify me via [place].

## 2018-07-28 NOTE — Addendum Note (Signed)
Addended by: Brunetta Jeans on: 07/28/2018 12:53 PM   Modules accepted: Orders

## 2018-07-28 NOTE — Telephone Encounter (Signed)
This has been taken care of.

## 2018-07-28 NOTE — Progress Notes (Signed)
I have discussed the procedure for the virtual visit with the patient who has given consent to proceed with assessment and treatment.   Latif Nazareno S Doniqua Saxby, CMA     

## 2018-07-28 NOTE — Progress Notes (Signed)
Virtual Visit via Video Note  I connected with Tyr Franca on 07/28/18 at 11:15 AM EDT by a video enabled telemedicine application and verified that I am speaking with the correct person using two identifiers.   I discussed the limitations of evaluation and management by telemedicine and the availability of in person appointments. The patient expressed understanding and agreed to proceed.  History of Present Illness: Patient endorses a couple days of sore throat and odynophagia. Notes some associated nasal congestion and a tickle in his throat. Has significant history of allergies but usually quickly controlled with Allegra. Notes taking this yesterday without improvement so has not restarted. Denies fever or chills. Denies shortness of breath or chest congestion. Notes dry cough. Daughters have had a cold this past week but are feeling better. Denies known contact with strep throat.    Observations/Objective: Head is normocephalic and atraumatic.  Voice is clear but can tell there is pain in throat with talking.  No labored breathing.  Alert and oriented.  Able to have patient shine light in throat and put phone closer to throat to examine oropharynx -- able to visualize well. There is posterior oropharyngeal erythema without tonsillar hypertrophy, exudate. Cobblestoning of posterior oropharynx noted.  Assessment and Plan: 1. Viral pharyngitis Combination viral infection along with his chronic rhinitis and PND. Start salt-water gargles and chloraseptic spray. Tylenol or Ibuprofen if needed. I have given a prescription for antibiotic to hold onto and start If any worsening throat pain or fever over the weekend (especially with all the chaos going on with coronavirus)  2. Chronic rhinitis Restart Allegra. Begin Flonase and saline nasal rinses.    Follow Up Instructions: Follow-up PRN if symptoms are not improving.   I discussed the assessment and treatment plan with the patient. The patient  was provided an opportunity to ask questions and all were answered. The patient agreed with the plan and demonstrated an understanding of the instructions.   The patient was advised to call back or seek an in-person evaluation if the symptoms worsen or if the condition fails to improve as anticipated.  I provided 15 minutes of non-face-to-face time during this encounter.   Leeanne Rio, PA-C

## 2018-07-28 NOTE — Telephone Encounter (Signed)
Pt called back, states that he uses [facility] in [place] and not [facility]. That was his mistake and asking if we could send them to [facility] please.

## 2018-10-24 ENCOUNTER — Ambulatory Visit (INDEPENDENT_AMBULATORY_CARE_PROVIDER_SITE_OTHER): Payer: Managed Care, Other (non HMO) | Admitting: Physician Assistant

## 2018-10-24 ENCOUNTER — Ambulatory Visit: Payer: Managed Care, Other (non HMO) | Admitting: Physician Assistant

## 2018-10-24 ENCOUNTER — Encounter: Payer: Self-pay | Admitting: Physician Assistant

## 2018-10-24 DIAGNOSIS — H109 Unspecified conjunctivitis: Secondary | ICD-10-CM

## 2018-10-24 MED ORDER — POLYMYXIN B-TRIMETHOPRIM 10000-0.1 UNIT/ML-% OP SOLN: OPHTHALMIC | 0 refills | Status: DC

## 2018-10-24 MED ORDER — EPINEPHRINE 0.3 MG/0.3ML IJ SOAJ: 0.3000 mg | INTRAMUSCULAR | 2 refills | Status: AC | PRN

## 2018-10-24 NOTE — Progress Notes (Signed)
   Virtual Visit via Video   I connected with patient on 10/24/18 at  4:00 PM EDT by a video enabled telemedicine application and verified that I am speaking with the correct person using two identifiers.  Location patient: Home Location provider: [facility], Office Persons participating in the virtual visit: Patient, Provider, PA-Student Anibal Henderson), CMA (Eduard Clos)  I discussed the limitations of evaluation and management by telemedicine and the availability of in person appointments. The patient expressed understanding and agreed to proceed.  Subjective:   HPI:   Patient presents today via [place] to discuss 2 days of eye symptoms. Patient endorses symptoms started yesterday morning, right eye, wakes up with eye glued shut. Worse in AM. Tearing intermittently. Sensitive to light and clear nasal drainage. Denies FB sensation, HA, sinus pressure, cough. Patient used some of his daughter's prior Rx for Polymixin B / trimethoprim 3x day (exp Jan 2021) started yesterday.  Notes some improvement. Patient does wear contacts, has not worn since Sunday. Denies trauma or injury to the eye. Denies sick contacts.   ROS:   See pertinent positives and negatives per HPI.  Patient Active Problem List   Diagnosis Date Noted  . Visit for preventive health examination 06/18/2016  . PND (post-nasal drip) 06/04/2016    Social History   Tobacco Use  . Smoking status: Never Smoker  . Smokeless tobacco: Never Used  Substance Use Topics  . Alcohol use: Yes    Alcohol/week: 6.0 - 8.0 standard drinks    Types: 6 - 8 Cans of beer per week   No current outpatient medications on file.  Allergies  Allergen Reactions  . Bee Venom Anaphylaxis    Objective:   There were no vitals taken for this visit.  Patient is well-developed, well-nourished in no acute distress.  Resting comfortably at home.  Head is normocephalic, atraumatic.  No labored breathing.  Speech is clear and  coherent with logical contest.  Patient is alert and oriented at baseline.  R eye with injected conjunctiva without sign of eyelid swelling. No drainage noted at time of video exam. L eye without normal findings.   Assessment and Plan:   1. Bacterial conjunctivitis of right eye Supportive measures reviewed. Start Adult dosing of Polytrim op solution. Strict return precautions reviewed with patient.  - trimethoprim-polymyxin b (POLYTRIM) ophthalmic solution; Apply 1-2 drops into affected eye QID x 5 days.  Dispense: 10 mL; Refill: 0    Leeanne Rio, [place] 10/24/2018

## 2018-10-24 NOTE — Patient Instructions (Signed)
Please apply the Polytrim as directed -- 1-2 drops four times daily for 5-7 days.  Warm compresses to the affected eye. Avoid rubbing the eyes.  Let me know if symptoms are not continuing to improve/resolve.   Bacterial Conjunctivitis, Adult Bacterial conjunctivitis is an infection of your conjunctiva. This is the clear membrane that covers the white part of your eye and the inner part of your eyelid. This infection can make your eye:  Red or pink.  Itchy. This condition spreads easily from person to person (is contagious) and from one eye to the other eye. What are the causes?  This condition is caused by germs (bacteria). You may get the infection if you come into close contact with: ? A person who has the infection. ? Items that have germs on them (are contaminated), such as face towels, contact lens solution, or eye makeup. What increases the risk? You are more likely to get this condition if you:  Have contact with people who have the infection.  Wear contact lenses.  Have a sinus infection.  Have had a recent eye injury or surgery.  Have a weak body defense system (immune system).  Have dry eyes. What are the signs or symptoms?   Thick, yellowish discharge from the eye.  Tearing or watery eyes.  Itchy eyes.  Burning feeling in your eyes.  Eye redness.  Swollen eyelids.  Blurred vision. How is this treated?   Antibiotic eye drops or ointment.  Antibiotic medicine taken by mouth. This is used for infections that do not get better with drops or ointment or that last more than 10 days.  Cool, wet cloths placed on the eyes.  Artificial tears used 2-6 times a day. Follow these instructions at home: Medicines  Take or apply your antibiotic medicine as told by your doctor. Do not stop taking or applying the antibiotic even if you start to feel better.  Take or apply over-the-counter and prescription medicines only as told by your doctor.  Do not touch  your eyelid with the eye-drop bottle or the ointment tube. Managing discomfort  Wipe any fluid from your eye with a warm, wet washcloth or a cotton ball.  Place a clean, cool, wet cloth on your eye. Do this for 10-20 minutes, 3-4 times per day. General instructions  Do not wear contacts until the infection is gone. Wear glasses until your doctor says it is okay to wear contacts again.  Do not wear eye makeup until the infection is gone. Throw away old eye makeup.  Change or wash your pillowcase every day.  Do not share towels or washcloths.  Wash your hands often with soap and water. Use paper towels to dry your hands.  Do not touch or rub your eyes.  Do not drive or use heavy machinery if your vision is blurred. Contact a doctor if:  You have a fever.  You do not get better after 10 days. Get help right away if:  You have a fever and your symptoms get worse all of a sudden.  You have very bad pain when you move your eye.  Your face: ? Hurts. ? Is red. ? Is swollen.  You have sudden loss of vision. Summary  Bacterial conjunctivitis is an infection of your conjunctiva.  This infection spreads easily from person to person.  Wash your hands often with soap and water. Use paper towels to dry your hands.  Take or apply your antibiotic medicine as told by your doctor.  Contact a doctor if you have a fever or you do not get better after 10 days. This information is not intended to replace advice given to you by your health care provider. Make sure you discuss any questions you have with your health care provider. Document Released: 01/27/2008 Document Revised: 11/23/2017 Document Reviewed: 11/23/2017 Elsevier Interactive Patient Education  2019 [employer].

## 2019-02-15 ENCOUNTER — Encounter: Payer: Managed Care, Other (non HMO) | Admitting: Physician Assistant

## 2019-02-20 ENCOUNTER — Ambulatory Visit (INDEPENDENT_AMBULATORY_CARE_PROVIDER_SITE_OTHER): Payer: Managed Care, Other (non HMO) | Admitting: Physician Assistant

## 2019-02-20 ENCOUNTER — Encounter: Payer: Self-pay | Admitting: Physician Assistant

## 2019-02-20 ENCOUNTER — Other Ambulatory Visit: Payer: Self-pay

## 2019-02-20 VITALS — BP 130/82 | HR 83 | Temp 98.6°F | Resp 16 | Ht 71.5 in | Wt 173.0 lb

## 2019-02-20 DIAGNOSIS — R634 Abnormal weight loss: Secondary | ICD-10-CM

## 2019-02-20 DIAGNOSIS — Z0000 Encounter for general adult medical examination without abnormal findings: Secondary | ICD-10-CM

## 2019-02-20 DIAGNOSIS — R748 Abnormal levels of other serum enzymes: Secondary | ICD-10-CM

## 2019-02-20 LAB — CBC WITH DIFFERENTIAL/PLATELET
Basophils Absolute: 0 10*3/uL (ref 0.0–0.1)
Basophils Relative: 1.4 % (ref 0.0–3.0)
Eosinophils Absolute: 0.1 10*3/uL (ref 0.0–0.7)
Eosinophils Relative: 2.2 % (ref 0.0–5.0)
HCT: 44.9 % (ref 39.0–52.0)
Hemoglobin: 14.9 g/dL (ref 13.0–17.0)
Lymphocytes Relative: 43.6 % (ref 12.0–46.0)
Lymphs Abs: 1.2 10*3/uL (ref 0.7–4.0)
MCHC: 33.2 g/dL (ref 30.0–36.0)
MCV: 92.8 fl (ref 78.0–100.0)
Monocytes Absolute: 0.4 10*3/uL (ref 0.1–1.0)
Monocytes Relative: 13.1 % — ABNORMAL HIGH (ref 3.0–12.0)
Neutro Abs: 1.1 10*3/uL — ABNORMAL LOW (ref 1.4–7.7)
Neutrophils Relative %: 39.7 % — ABNORMAL LOW (ref 43.0–77.0)
Platelets: 62 10*3/uL — ABNORMAL LOW (ref 150.0–400.0)
RBC: 4.84 Mil/uL (ref 4.22–5.81)
RDW: 13.9 % (ref 11.5–15.5)
WBC: 2.8 10*3/uL — ABNORMAL LOW (ref 4.0–10.5)

## 2019-02-20 LAB — LIPID PANEL
Cholesterol: 311 mg/dL — ABNORMAL HIGH (ref 0–200)
HDL: 161.1 mg/dL (ref >39.00)
LDL Cholesterol: 136 mg/dL — ABNORMAL HIGH (ref 0–99)
NonHDL: 149.88
Total CHOL/HDL Ratio: 2
Triglycerides: 69 mg/dL (ref 0.0–149.0)
VLDL: 13.8 mg/dL (ref 0.0–40.0)

## 2019-02-20 LAB — COMPREHENSIVE METABOLIC PANEL
ALT: 124 U/L — ABNORMAL HIGH (ref 0–53)
AST: 268 U/L — ABNORMAL HIGH (ref 0–37)
Albumin: 4.7 g/dL (ref 3.5–5.2)
Alkaline Phosphatase: 75 U/L (ref 39–117)
BUN: 7 mg/dL (ref 6–23)
CO2: 26 mEq/L (ref 19–32)
Calcium: 9.2 mg/dL (ref 8.4–10.5)
Chloride: 101 mEq/L (ref 96–112)
Creatinine, Ser: 0.77 mg/dL (ref 0.40–1.50)
GFR: 112.41 mL/min (ref >60.00)
Glucose, Bld: 76 mg/dL (ref 70–99)
Potassium: 4.2 mEq/L (ref 3.5–5.1)
Sodium: 138 mEq/L (ref 135–145)
Total Bilirubin: 1.3 mg/dL — ABNORMAL HIGH (ref 0.2–1.2)
Total Protein: 7.3 g/dL (ref 6.0–8.3)

## 2019-02-20 LAB — TSH: TSH: 1.38 u[IU]/mL (ref 0.35–4.50)

## 2019-02-20 LAB — HEMOGLOBIN A1C: Hgb A1c MFr Bld: 4.9 % (ref 4.6–6.5)

## 2019-02-20 NOTE — Progress Notes (Signed)
Patient presents to clinic today for annual exam.  Patient is fasting for labs. Patient denies acute concerns today. Is trying to keep a balanced diet but only eats 2 meals per day (lunch and dinner). Is going on walks twice daily for exercise. Has noted some weight loss over the past 6 months. Denies heat intolerance, change to bowel habits, racing heart, jitteriness or anxiety. Is sleeping well. Body mass index is 23.79 kg/m. Denies unexplainable fevers or night sweats.  Health Maintenance: Immunizations -- declines flu shot today  Past Medical History:  Diagnosis Date  . 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 NO PAST SURGERIES      Current Outpatient Medications on File Prior to Visit  Medication Sig Dispense Refill  . EPINEPHrine 0.3 mg/0.3 mL IJ SOAJ injection Inject 0.3 mLs (0.3 mg total) into the muscle as needed for anaphylaxis. 1 each 2   No current facility-administered medications on file prior to visit.     Allergies  Allergen Reactions  . Bee Venom Anaphylaxis    Family History  Problem Relation Age of Onset  . Healthy Mother   . Cancer Father 77       Small Cell Carcinoma  . Healthy Brother   . Healthy Maternal Grandmother   . Cancer Maternal Grandfather        Kidney  . Dementia Paternal Grandmother   . Diabetes Paternal Grandfather   . Hypertension Paternal Grandfather   . Healthy Child   . Healthy Child   . Healthy Child     Social History   Socioeconomic History  . Marital status: Married    Spouse name: Mel Almond  . Number of children: 3  . Years of education: 4  . Highest education level: Not on file  Occupational History  . Occupation: Freight forwarder    Comment: [employer]  . Financial resource strain: Not on file  . Food insecurity    Worry: Not on file    Inability: Not on file  . Transportation needs    Medical: Not on file    Non-medical: Not on file   Tobacco Use  . Smoking status: Never Smoker  . Smokeless tobacco: Never Used  Substance and Sexual Activity  . Alcohol use: Yes    Alcohol/week: 6.0 - 8.0 standard drinks    Types: 6 - 8 Cans of beer per week  . Drug use: No  . Sexual activity: Yes    Partners: Female    Comment: wife has Nexplanon  Lifestyle  . Physical activity    Days per week: Not on file    Minutes per session: Not on file  . Stress: Not on file  Relationships  . Social Herbalist on phone: Not on file    Gets together: Not on file    Attends religious service: Not on file    Active member of club or organization: Not on file    Attends meetings of clubs or organizations: Not on file    Relationship status: Not on file  . Intimate partner violence    Fear of current or ex partner: Not on file    Emotionally abused: Not on file    Physically abused: Not on file    Forced sexual activity: Not on file  Other Topics Concern  . Not on file  Social History Narrative  . Not on  file   Review of Systems  Constitutional: Positive for weight loss (x 6 months. ). Negative for chills and fever.  HENT: Positive for hearing loss (Bilateral - chronic. Followed by Audiology). Negative for ear discharge, ear pain and tinnitus.   Eyes: Negative for blurred vision and double vision.  Respiratory: Negative for cough, shortness of breath and wheezing.   Cardiovascular: Negative for chest pain and palpitations.  Gastrointestinal: Negative for abdominal pain, constipation, diarrhea, nausea and vomiting.  Genitourinary: Negative for dysuria, frequency and urgency.  Neurological: Negative for weakness and headaches.  Psychiatric/Behavioral: Negative for depression. The patient is not nervous/anxious and does not have insomnia.    BP 130/82   Pulse 83   Temp 98.6 F (37 C) (Temporal)   Resp 16   Ht 5' 11.5" (1.816 m)   Wt 173 lb (78.5 kg)   SpO2 98%   BMI 23.79 kg/m   Physical Exam Constitutional:       General: He is not in acute distress.    Appearance: Normal appearance.  HENT:     Head: Normocephalic and atraumatic.     Right Ear: Tympanic membrane normal.     Left Ear: Tympanic membrane normal.  Eyes:     Conjunctiva/sclera: Conjunctivae normal.     Pupils: Pupils are equal, round, and reactive to light.  Cardiovascular:     Rate and Rhythm: Normal rate and regular rhythm.  Pulmonary:     Effort: Pulmonary effort is normal.     Breath sounds: Normal breath sounds.  Abdominal:     General: Abdomen is flat. Bowel sounds are normal.     Palpations: Abdomen is soft. There is no mass.     Tenderness: There is no abdominal tenderness.  Skin:    General: Skin is warm and dry.  Neurological:     Mental Status: He is alert.  Psychiatric:        Mood and Affect: Mood normal.        Behavior: Behavior normal.    Assessment/Plan: 1. Visit for preventive health examination Depression screen negative. Health Maintenance reviewed -- Declines flu shot today. Preventive schedule discussed and handout given in AVS. Will obtain fasting labs today.  - Comprehensive metabolic panel - CBC with Differential/Platelet - Hemoglobin A1c - Lipid panel - TSH  2. Weight loss 8-10 pound weight loss in 1 year. Only eating 2 meals per day. No snacking. Will check full labs today to include TSH to further assess.    Leeanne Rio, PA-C

## 2019-02-20 NOTE — Patient Instructions (Signed)
Please go to the lab for blood work.   Our office will call you with your results unless you have chosen to receive results via MyChart.  If your blood work is normal we will follow-up each year for physicals and as scheduled for chronic medical problems.  If anything is abnormal we will treat accordingly and get you in for a follow-up.    Preventive Care 21-39 Years Old, Male Preventive care refers to lifestyle choices and visits with your health care provider that can promote health and wellness. This includes:  A yearly physical exam. This is also called an annual well check.  Regular dental and eye exams.  Immunizations.  Screening for certain conditions.  Healthy lifestyle choices, such as eating a healthy diet, getting regular exercise, not using drugs or products that contain nicotine and tobacco, and limiting alcohol use. What can I expect for my preventive care visit? Physical exam Your health care provider will check:  Height and weight. These may be used to calculate body mass index (BMI), which is a measurement that tells if you are at a healthy weight.  Heart rate and blood pressure.  Your skin for abnormal spots. Counseling Your health care provider may ask you questions about:  Alcohol, tobacco, and drug use.  Emotional well-being.  Home and relationship well-being.  Sexual activity.  Eating habits.  Work and work environment. What immunizations do I need?  Influenza (flu) vaccine  This is recommended every year. Tetanus, diphtheria, and pertussis (Tdap) vaccine  You may need a Td booster every 10 years. Varicella (chickenpox) vaccine  You may need this vaccine if you have not already been vaccinated. Human papillomavirus (HPV) vaccine  If recommended by your health care provider, you may need three doses over 6 months. Measles, mumps, and rubella (MMR) vaccine  You may need at least one dose of MMR. You may also need a second dose.  Meningococcal conjugate (MenACWY) vaccine  One dose is recommended if you are 19-21 years old and a first-year college student living in a residence hall, or if you have one of several medical conditions. You may also need additional booster doses. Pneumococcal conjugate (PCV13) vaccine  You may need this if you have certain conditions and were not previously vaccinated. Pneumococcal polysaccharide (PPSV23) vaccine  You may need one or two doses if you smoke cigarettes or if you have certain conditions. Hepatitis A vaccine  You may need this if you have certain conditions or if you travel or work in places where you may be exposed to hepatitis A. Hepatitis B vaccine  You may need this if you have certain conditions or if you travel or work in places where you may be exposed to hepatitis B. Haemophilus influenzae type b (Hib) vaccine  You may need this if you have certain risk factors. You may receive vaccines as individual doses or as more than one vaccine together in one shot (combination vaccines). Talk with your health care provider about the risks and benefits of combination vaccines. What tests do I need? Blood tests  Lipid and cholesterol levels. These may be checked every 5 years starting at age 20.  Hepatitis C test.  Hepatitis B test. Screening   Diabetes screening. This is done by checking your blood sugar (glucose) after you have not eaten for a while (fasting).  Sexually transmitted disease (STD) testing. Talk with your health care provider about your test results, treatment options, and if necessary, the need for more tests. Follow   these instructions at home: Eating and drinking   Eat a diet that includes fresh fruits and vegetables, whole grains, lean protein, and low-fat dairy products.  Take vitamin and mineral supplements as recommended by your health care provider.  Do not drink alcohol if your health care provider tells you not to drink.  If you drink  alcohol: ? Limit how much you have to 0-2 drinks a day. ? Be aware of how much alcohol is in your drink. In the [place] one drink equals one 12 oz bottle of beer (355 mL), one 5 oz glass of wine (148 mL), or one 1 oz glass of hard liquor (44 mL). Lifestyle  Take daily care of your teeth and gums.  Stay active. Exercise for at least 30 minutes on 5 or more days each week.  Do not use any products that contain nicotine or tobacco, such as cigarettes, e-cigarettes, and chewing tobacco. If you need help quitting, ask your health care provider.  If you are sexually active, practice safe sex. Use a condom or other form of protection to prevent STIs (sexually transmitted infections). What's next?  Go to your health care provider once a year for a well check visit.  Ask your health care provider how often you should have your eyes and teeth checked.  Stay up to date on all vaccines. This information is not intended to replace advice given to you by your health care provider. Make sure you discuss any questions you have with your health care provider. Document Released: 06/15/2001 Document Revised: 04/13/2018 Document Reviewed: 04/13/2018 Elsevier Patient Education  2020 Elsevier Inc.  

## 2019-02-20 NOTE — Addendum Note (Signed)
Addended by: Jodell Cipro on: 02/20/2019 02:36 PM   Modules accepted: Orders

## 2019-02-21 LAB — HEPATITIS PANEL, ACUTE
Hep A IgM: NONREACTIVE
Hep B C IgM: NONREACTIVE
Hepatitis B Surface Ag: NONREACTIVE
Hepatitis C Ab: NONREACTIVE
SIGNAL TO CUT-OFF: 0.01 (ref <1.00)

## 2019-02-23 ENCOUNTER — Other Ambulatory Visit: Payer: Self-pay | Admitting: Emergency Medicine

## 2019-02-23 ENCOUNTER — Telehealth: Payer: Self-pay

## 2019-02-23 DIAGNOSIS — R748 Abnormal levels of other serum enzymes: Secondary | ICD-10-CM

## 2019-02-23 NOTE — Telephone Encounter (Signed)
This information in appreciated. Will discuss with him when calling with liver US results.

## 2019-02-23 NOTE — Telephone Encounter (Signed)
Patient's wife, Randy Brooks [phone], called in stating that her husband did not know she was calling but she did not want Einar Pheasant going on a "wild goose chase" trying to figure out what is wrong with her husband's liver. Stated that patient has not been telling Einar Pheasant the truth about his alcohol addiction. This is something that he has dealt with for over 15 years, with several family members also dealing with addiction. States that patient is in complete denial of his addiction and "shuts down" when confronted by anyone. Over the past 8 months, patient has had to work from home and has a high stress job. Stated that patient wakes up most mornings shaking and starts drinking around 10:30 AM until he goes to bed. Admitted that patient will go through upwards of 4 cases of beer per week, usually 50+ cans per weekend. Clarified that patient is not aggressive when intoxicated, acts "goofy" and "sleepy."  Randy Brooks states that patient has been dealing with short term memory loss and hearing loss that is progressively worsening. Stated that their children are getting older and it worries her that they will start noticing how much their father drinks and how it effects his mood and health.  Informed Randy Brooks that I would send message to [place]. Waiting on [place] of liver to be scheduled.

## 2019-02-27 ENCOUNTER — Encounter: Payer: Self-pay | Admitting: *Deleted

## 2019-02-27 ENCOUNTER — Encounter: Payer: Self-pay | Admitting: Emergency Medicine

## 2019-03-26 ENCOUNTER — Ambulatory Visit
Admission: RE | Admit: 2019-03-26 | Discharge: 2019-03-26 | Disposition: A | Discharge status: Home / Self Care | Payer: Managed Care, Other (non HMO) | Source: Ambulatory Visit | Attending: Physician Assistant | Admitting: Physician Assistant

## 2019-03-26 DIAGNOSIS — R748 Abnormal levels of other serum enzymes: Secondary | ICD-10-CM

## 2019-03-28 ENCOUNTER — Other Ambulatory Visit: Payer: Self-pay | Admitting: Emergency Medicine

## 2019-03-28 DIAGNOSIS — N2889 Other specified disorders of kidney and ureter: Secondary | ICD-10-CM

## 2019-04-21 ENCOUNTER — Other Ambulatory Visit: Payer: Self-pay

## 2019-04-21 ENCOUNTER — Ambulatory Visit
Admission: RE | Admit: 2019-04-21 | Discharge: 2019-04-21 | Disposition: A | Discharge status: Home / Self Care | Payer: Managed Care, Other (non HMO) | Source: Ambulatory Visit | Attending: Physician Assistant | Admitting: Physician Assistant

## 2019-04-21 DIAGNOSIS — N2889 Other specified disorders of kidney and ureter: Secondary | ICD-10-CM

## 2019-04-21 MED ORDER — GADOBENATE DIMEGLUMINE 529 MG/ML IV SOLN
16.0000 mL | Freq: Once | INTRAVENOUS | Status: AC | PRN
  Administered 2019-04-21: 16 mL via INTRAVENOUS

## 2019-05-02 ENCOUNTER — Ambulatory Visit (INDEPENDENT_AMBULATORY_CARE_PROVIDER_SITE_OTHER): Payer: Managed Care, Other (non HMO) | Admitting: Physician Assistant

## 2019-05-02 ENCOUNTER — Other Ambulatory Visit: Payer: Self-pay

## 2019-05-02 ENCOUNTER — Encounter: Payer: Self-pay | Admitting: Physician Assistant

## 2019-05-02 DIAGNOSIS — D3001 Benign neoplasm of right kidney: Secondary | ICD-10-CM | POA: Diagnosis not present

## 2019-05-02 DIAGNOSIS — N281 Cyst of kidney, acquired: Secondary | ICD-10-CM

## 2019-05-02 DIAGNOSIS — R748 Abnormal levels of other serum enzymes: Secondary | ICD-10-CM

## 2019-05-02 DIAGNOSIS — D1771 Benign lipomatous neoplasm of kidney: Secondary | ICD-10-CM

## 2019-05-02 NOTE — Progress Notes (Signed)
Virtual Visit via Video   I connected with patient on 05/02/19 at  9:00 AM EST by a video enabled telemedicine application and verified that I am speaking with the correct person using two identifiers.  Location patient: Home Location provider: [facility] Persons participating in the virtual visit: Patient, Provider, [place] (Patina Moore)  I discussed the limitations of evaluation and management by telemedicine and the availability of in person appointments. The patient expressed understanding and agreed to proceed.  Subjective:   HPI:        Patient presents via video visit today for further discussion regarding liver enzyme elevation.  Patient was seen 02/20/2019 for annual examination at which time liver enzymes were noted to be moderately elevated with AST greater than ALT.  Hepatitis panel was negative.  At that time he endorsed very little alcohol consumption and so a liver ultrasound was obtained to further assess.  Ultrasound revealed changes consistent with hepatic steatosis but also revealed a potential mass in the R kidney.  As such patient underwent MRI of the abdomen that confirmed moderate hepatic steatosis and thankfully was negative for renal malignancy, noting 1 cyst and 1 angiomyolipoma.     Patient endorses feeling well overall.  States he was drinking 6+ beers nightly and has cut that in half.  Denies use of Tylenol or ibuprofen products.  Is keeping well hydrated.  Notes well-balanced diet.  Has been home more recently due to Covid so actually notes his physical activity is much improved.  ROS:   See pertinent positives and negatives per HPI.  Patient Active Problem List   Diagnosis Date Noted  . Visit for preventive health examination 06/18/2016  . PND (post-nasal drip) 06/04/2016    Social History   Tobacco Use  . Smoking status: Never Smoker  . Smokeless tobacco: Never Used  Substance Use Topics  . Alcohol use: Yes    Alcohol/week: 6.0 - 8.0  standard drinks    Types: 6 - 8 Cans of beer per week    Current Outpatient Medications:  .  EPINEPHrine 0.3 mg/0.3 mL IJ SOAJ injection, Inject 0.3 mLs (0.3 mg total) into the muscle as needed for anaphylaxis., Disp: 1 each, Rfl: 2  Allergies  Allergen Reactions  . Bee Venom Anaphylaxis    Objective:   There were no vitals taken for this visit.  Patient is well-developed, well-nourished in no acute distress.  Resting comfortably at home.  Head is normocephalic, atraumatic.  No labored breathing.  Speech is clear and coherent with logical content.  Patient is alert and oriented at baseline.   Assessment and Plan:   1. Elevated liver enzymes Hepatic steatosis diagnosed via ultrasound and confirmed with subsequent MRI.  No noted signs of cirrhosis.  Suspect liver enzymes are somewhat elevated due to fatty liver but biggest concern is that his previous significant alcohol consumption was the main factor in elevation.  Dietary and exercise recommendations reviewed with patient.  He has cut down on alcohol consumption quite significantly.  Encouraged continued wean.  Lab appointment scheduled so we can reassess his liver function.  If levels not improving will continue to work on complete alcohol cessation and get hepatology on board. 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  Will repeat noncontrast CT in 3 months per radiologist recommendations.  3. Renal cyst Noted on MRI.  Will obtain noncontrast CT in 3 months per radiologist recommendations.  Reminder placed in system.  Leeanne Rio, PA-C 05/02/2019

## 2019-05-02 NOTE — Progress Notes (Signed)
I have discussed the procedure for the virtual visit with the patient who has given consent to proceed with assessment and treatment.   Korban Shearer S Falisha Osment, CMA     

## 2019-05-09 ENCOUNTER — Ambulatory Visit: Payer: Managed Care, Other (non HMO)

## 2019-05-16 ENCOUNTER — Ambulatory Visit (INDEPENDENT_AMBULATORY_CARE_PROVIDER_SITE_OTHER): Payer: Managed Care, Other (non HMO) | Admitting: Emergency Medicine

## 2019-05-16 ENCOUNTER — Other Ambulatory Visit: Payer: Self-pay

## 2019-05-16 DIAGNOSIS — R748 Abnormal levels of other serum enzymes: Secondary | ICD-10-CM | POA: Diagnosis not present

## 2019-05-16 LAB — HEPATIC FUNCTION PANEL
ALT: 102 U/L — ABNORMAL HIGH (ref 0–53)
AST: 202 U/L — ABNORMAL HIGH (ref 0–37)
Albumin: 4.6 g/dL (ref 3.5–5.2)
Alkaline Phosphatase: 80 U/L (ref 39–117)
Bilirubin, Direct: 0.2 mg/dL (ref 0.0–0.3)
Total Bilirubin: 0.9 mg/dL (ref 0.2–1.2)
Total Protein: 7.3 g/dL (ref 6.0–8.3)

## 2019-05-31 ENCOUNTER — Encounter: Payer: Self-pay | Admitting: Physician Assistant

## 2019-05-31 DIAGNOSIS — L709 Acne, unspecified: Secondary | ICD-10-CM

## 2019-06-11 ENCOUNTER — Encounter: Payer: Self-pay | Admitting: Physician Assistant

## 2019-07-23 ENCOUNTER — Telehealth: Payer: Self-pay | Admitting: Physician Assistant

## 2019-07-23 DIAGNOSIS — D3001 Benign neoplasm of right kidney: Secondary | ICD-10-CM

## 2019-07-23 DIAGNOSIS — N2889 Other specified disorders of kidney and ureter: Secondary | ICD-10-CM

## 2019-07-23 DIAGNOSIS — D1771 Benign lipomatous neoplasm of kidney: Secondary | ICD-10-CM

## 2019-07-23 NOTE — Telephone Encounter (Signed)
It is time for repeat CT for patient due to renal lesion on prior MRI. Order has been placed. He will be contacted to schedule once pre-certified through insurance.

## 2019-07-25 ENCOUNTER — Encounter: Payer: Self-pay | Admitting: Emergency Medicine

## 2019-07-25 NOTE — Telephone Encounter (Signed)
Patient notified of repeat testing. Schedulers are working to get CT pre-cert before scheduling

## 2020-07-02 IMAGING — MR MR ABDOMEN WO/W CM
16 of 17 series · 45 of 48 positions shown · IV contrast (16ml Multihance)
Comparison: Renal sonogram March 26, 2019

CLINICAL DATA: Renal mass.

EXAM:
MRI ABDOMEN WITHOUT AND WITH CONTRAST
TECHNIQUE: Multiplanar multisequence MR imaging of the abdomen was performed
both before and after the administration of intravenous contrast.
CONTRAST:  16mL MULTIHANCE GADOBENATE DIMEGLUMINE 529 MG/ML IV SOLN

[Series 2: T2 · coronal · 5.0mm · 0.74mm/px · 2 of 35 slices shown (1 of 3)]
[im 1/35]
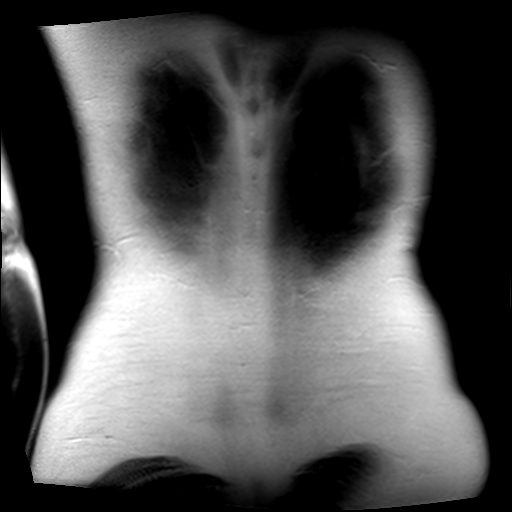
[im 35/35]
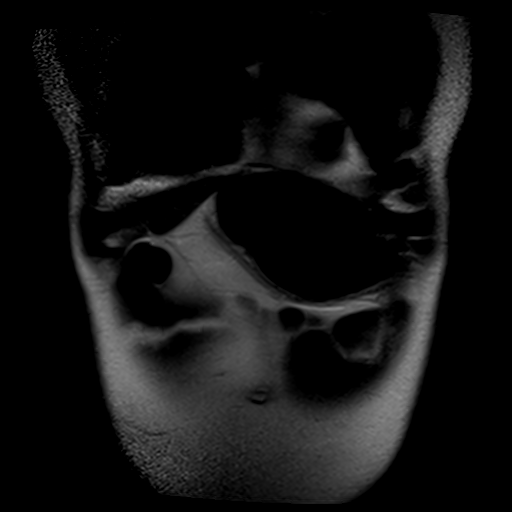

[Series 3: T2 · axial · 5.0mm · 0.74mm/px · z∈[-150,+71]mm · 2 of 38 slices shown (2 of 3)]
[im 1/38]
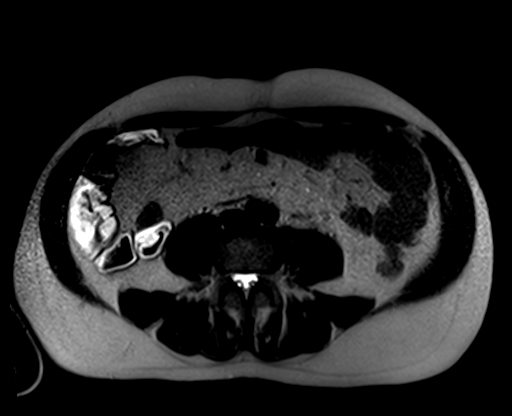
[im 38/38]
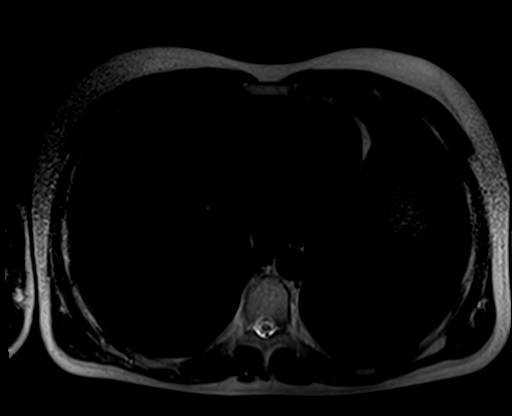

[Series 4: bSSFP · axial · 4.0mm · 0.74mm/px · z∈[-147,+68]mm · 3 of 55 slices shown]
[im 1/55]
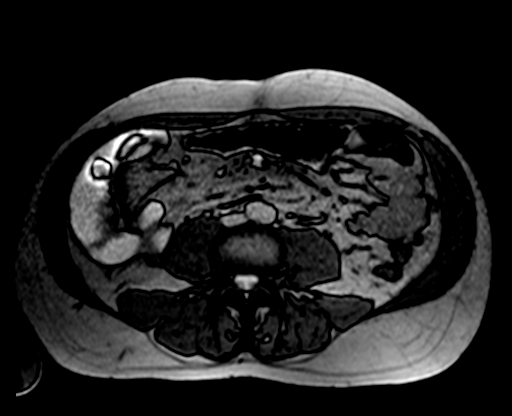
[im 28/55]
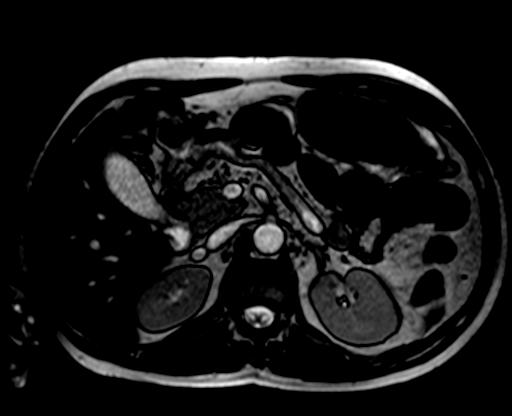
[im 55/55]
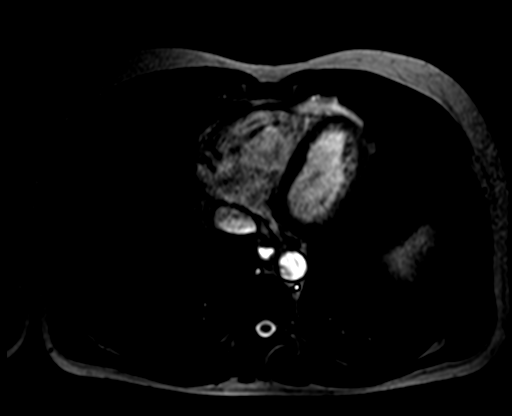

[Series 5: T1 · axial · 5.0mm · 0.74mm/px · z∈[-150,+71]mm · 4 of 76 slices shown]
[im 1/76]
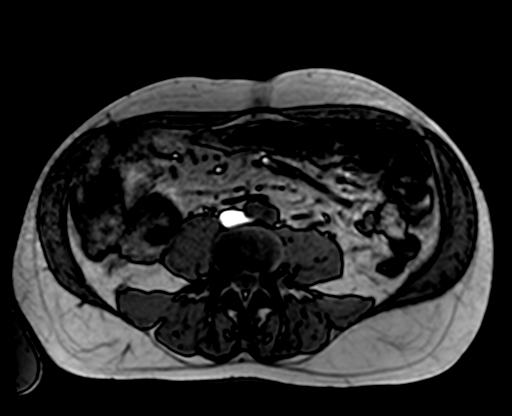
[im 26/76]
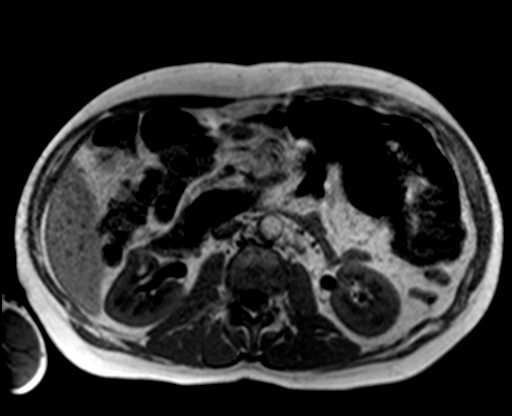
[im 51/76]
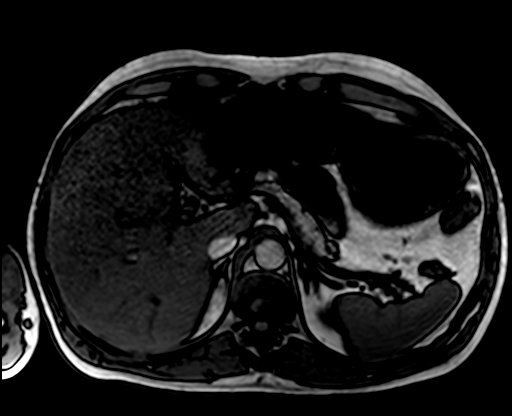
[im 76/76]
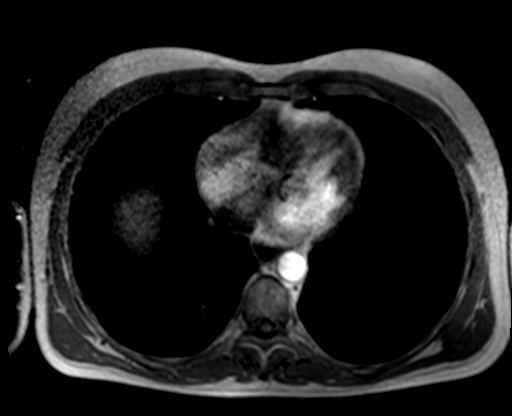

[Series 6: ep2d_[id number]_500_800_p2_trig · axial · 5.0mm · 1.98mm/px · z∈[-150,+71]mm · 5 of 114 slices shown]
[im 1/114]
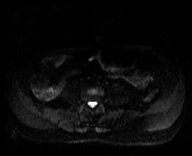
[im 29/114]
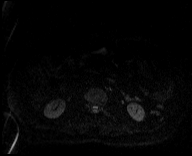
[im 57/114]
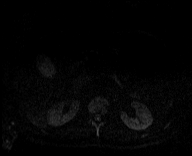
[im 85/114]
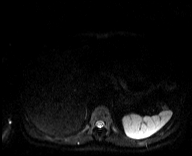
[im 114/114]
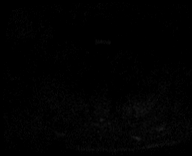

[Series 7: ep2d_[id number]_500_800_p2_trig_adc · axial · 5.0mm · 1.98mm/px · z∈[-150,+71]mm · 2 of 38 slices shown]
[im 1/38]
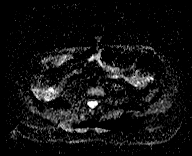
[im 38/38]
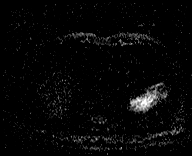

[Series 8: T2 · axial · 5.0mm · 1.48mm/px · 1 of 38 slices shown (3 of 3)]
[im 1/38]
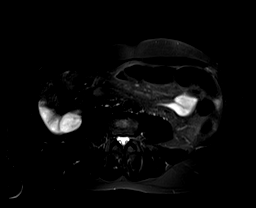

[Series 9: T1 dynamic · axial · non-contrast · 2.3mm · 1.48mm/px · z∈[-139,+60]mm · 3 of 88 slices shown]
[im 1/88]
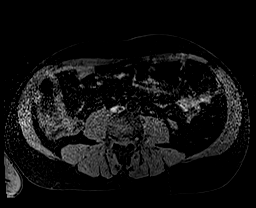
[im 44/88]
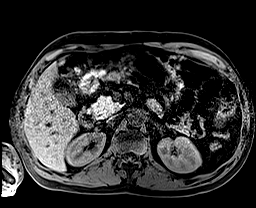
[im 88/88]
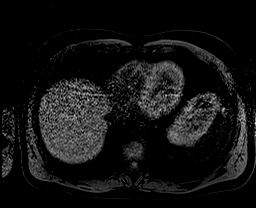

[Series 10: post 25 sec · axial · 2.3mm · 1.48mm/px · z∈[-139,+60]mm · 3 of 88 slices shown]
[im 1/88]
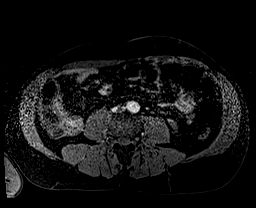
[im 44/88]
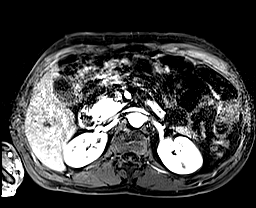
[im 88/88]
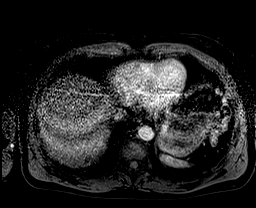

[Series 11: post 25 sec_sub · axial · 2.3mm · 1.48mm/px · z∈[-139,+60]mm · 3 of 88 slices shown]
[im 1/88]
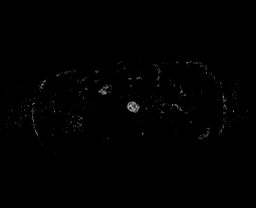
[im 44/88]
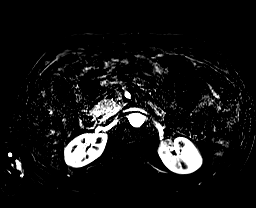
[im 88/88]
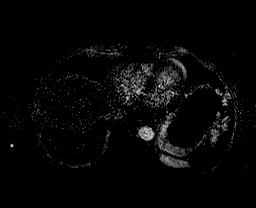

[Series 12: post 45 sec · axial · 2.3mm · 1.48mm/px · z∈[-139,+60]mm · 3 of 88 slices shown]
[im 1/88]
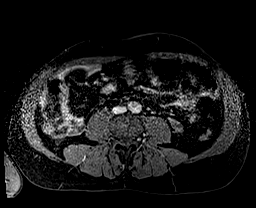
[im 44/88]
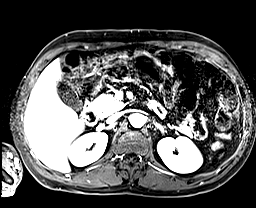
[im 88/88]
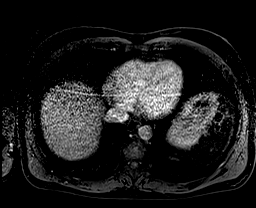

[Series 13: post 45 sec_sub · axial · 2.3mm · 1.48mm/px · z∈[-139,+60]mm · 3 of 88 slices shown]
[im 1/88]
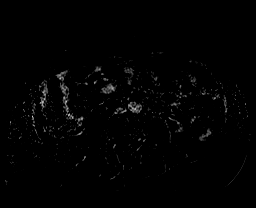
[im 44/88]
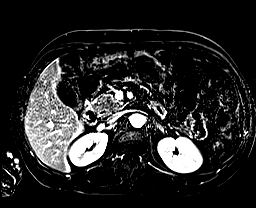
[im 88/88]
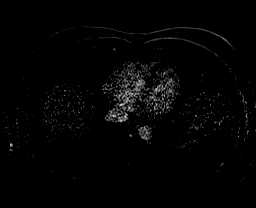

[Series 14: post 90 sec · axial · 2.3mm · 1.48mm/px · z∈[-139,+60]mm · 3 of 88 slices shown]
[im 1/88]
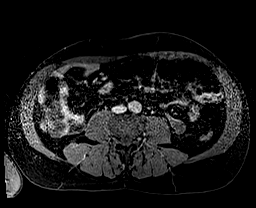
[im 44/88]
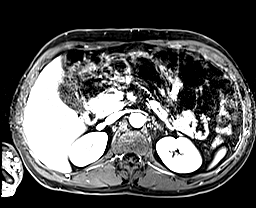
[im 88/88]
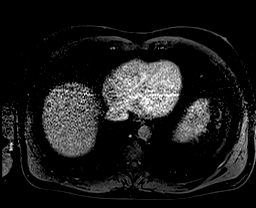

[Series 15: post 90 sec_sub · axial · 2.3mm · 1.48mm/px · z∈[-139,+60]mm · 3 of 88 slices shown]
[im 1/88]
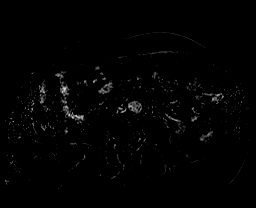
[im 44/88]
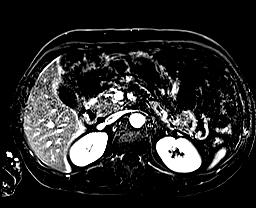
[im 88/88]
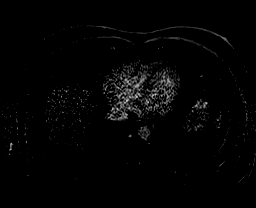

[Series 16: T1 dynamic post-contrast · coronal · 2.5mm · 0.74mm/px · 2 of 64 slices shown]
[im 1/64]
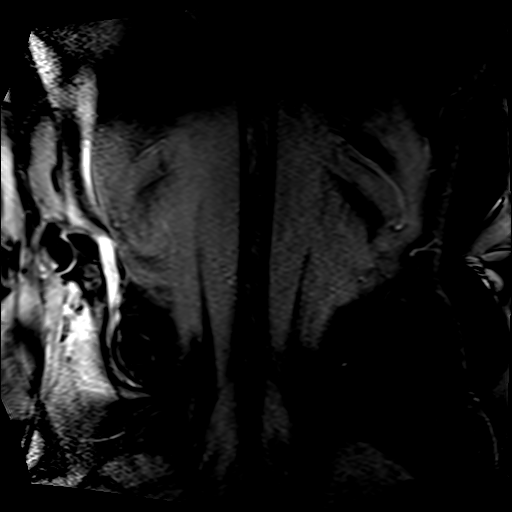
[im 64/64]
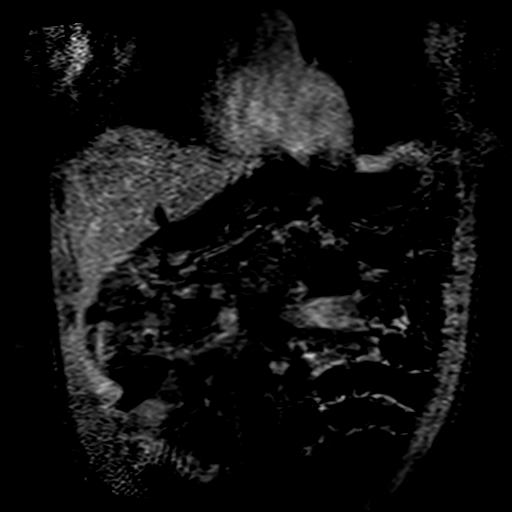

[Series 17: post axial 3+ · axial · 2.3mm · 1.48mm/px · z∈[-139,+60]mm · 3 of 88 slices shown]
[im 1/88]
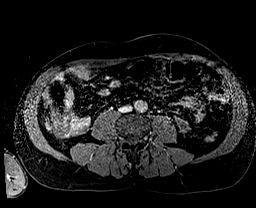
[im 44/88]
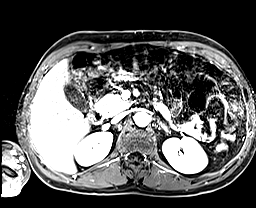
[im 88/88]
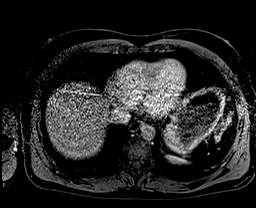

[45 of 48 positions shown; findings below may reference images not displayed]

FINDINGS: Lower chest: No sign of consolidation or pleural effusion at the
lung bases. Assessment of lung bases is limited on MRI.

Hepatobiliary: No suspicious focal hepatic lesion. Small hepatic
cysts are seen posteriorly in the right hepatic lobe. Moderate
hepatic steatosis.

Pancreas:  Normal appearance of the pancreas.

Spleen:  Spleen is normal.

Adrenals/Urinary Tract:  Adrenal glands are unremarkable.

Arising from the anterior hilar lip of the right kidney biased
towards the lower pole is a mesophytic lesion measuring
approximately 2.0 x 2.1 cm. There is an area of macroscopic fat the
passes through the lesion away from the renal sinus. This area shows
heterogeneous enhancement. There is a 9 mm area of T1 hyperintensity
inferior to this that shows indistinct subtle enhancement but is
largely nonenhancing on subtraction images with less crisply defined
margins than would be expected for a simple cyst but without
masslike features. Small cysts are noted in the upper and lower pole
of the right kidney. Also a small cyst present in the upper pole the
left kidney.

Stomach/Bowel: MR appearance sub bowel is unremarkable to the extent
visualized.

Vascular/Lymphatic: No signs of adenopathy in the upper abdomen or
retroperitoneum. Patent renal vein.

Other:  None.

Musculoskeletal: No suspicious bone finding.
IMPRESSION: 1. Nearly 2 cm lesion that is likely an angiomyolipoma arising from
the anterior interpolar right kidney. Assessment with noncontrast CT
may be helpful. For confirmation noncontrast CT may be helpful, this
could be performed within 3 months time.
2. What is likely a small hemorrhagic cyst arising from the lower
pole the right kidney, indistinct margins on today's study.
Currently classified as Bosniak 2 F, attention on subsequent imaging
study.
3. Moderate hepatic steatosis.

## 2020-09-11 ENCOUNTER — Telehealth: Payer: Self-pay

## 2020-09-11 NOTE — Telephone Encounter (Signed)
LVM advising patient to call back and schedule TOC. 

## 2022-06-21 ENCOUNTER — Telehealth: Payer: 59 | Admitting: Family Medicine

## 2022-06-21 DIAGNOSIS — H44001 Unspecified purulent endophthalmitis, right eye: Secondary | ICD-10-CM | POA: Diagnosis not present

## 2022-06-21 MED ORDER — POLYMYXIN B-TRIMETHOPRIM 10000-0.1 UNIT/ML-% OP SOLN: 1.0000 [drp] | Freq: Four times a day (QID) | OPHTHALMIC | 0 refills | Status: AC

## 2022-06-21 NOTE — Progress Notes (Signed)
Virtual Visit Consent   Randy Brooks, you are scheduled for a virtual visit with a [facility] provider today. Just as with appointments in the office, your consent must be obtained to participate. Your consent will be active for this visit and any virtual visit you may have with one of our providers in the next 365 days. If you have a MyChart account, a copy of this consent can be sent to you electronically.  As this is a virtual visit, video technology does not allow for your provider to perform a traditional examination. This may limit your provider's ability to fully assess your condition. If your provider identifies any concerns that need to be evaluated in person or the need to arrange testing (such as labs, EKG, etc.), we will make arrangements to do so. Although advances in technology are sophisticated, we cannot ensure that it will always work on either your end or our end. If the connection with a video visit is poor, the visit may have to be switched to a telephone visit. With either a video or telephone visit, we are not always able to ensure that we have a secure connection.  By engaging in this virtual visit, you consent to the provision of healthcare and authorize for your insurance to be billed (if applicable) for the services provided during this visit. Depending on your insurance coverage, you may receive a charge related to this service.  I need to obtain your verbal consent now. Are you willing to proceed with your visit today? Randy Brooks has provided verbal consent on 06/21/2022 for a virtual visit (video or telephone). Perlie Mayo, NP  Date: 06/21/2022 11:17 AM  Virtual Visit via Video Note   I, Perlie Mayo, connected with  Randy Brooks  ([id number], Jul 18, 1980) on 06/21/22 at 11:15 AM EST by a video-enabled telemedicine application and verified that I am speaking with the correct person using two identifiers.  Location: Patient: Virtual Visit Location Patient:  Home Provider: Virtual Visit Location Provider: Home Office   I discussed the limitations of evaluation and management by telemedicine and the availability of in person appointments. The patient expressed understanding and agreed to proceed.    History of Present Illness: Randy Brooks is a 42 y.o. who identifies as a male who was assigned male at birth, and is being seen today for red sore right eye.   HPI: Conjunctivitis  The current episode started 2 days ago (woke up with it Sunday). The onset was sudden. The problem has been gradually worsening. The problem is moderate. Relieved by: eye drops. The symptoms are aggravated by light. Associated symptoms include photophobia, eye discharge and eye redness. Pertinent negatives include no fever, no decreased vision, no double vision, no eye itching, no congestion, no ear discharge, no ear pain, no headaches, no hearing loss, no mouth sores, no rhinorrhea, no sore throat, no stridor, no swollen glands and no eye pain. The eye pain is moderate. The right eye is affected. The eye pain is not associated with movement. The eyelid exhibits redness.    Problems:  Patient Active Problem List   Diagnosis Date Noted   Visit for preventive health examination 06/18/2016   PND (post-nasal drip) 06/04/2016    Allergies:  Allergies  Allergen Reactions   Bee Venom Anaphylaxis   Medications:  Current Outpatient Medications:    EPINEPHrine 0.3 mg/0.3 mL IJ SOAJ injection, Inject 0.3 mLs (0.3 mg total) into the muscle as needed for anaphylaxis., Disp: 1 each, Rfl: 2  Observations/Objective: Patient is well-developed, well-nourished in no acute distress.  Resting comfortably  at home.  Head is normocephalic, atraumatic.  No labored breathing.  Speech is clear and coherent with logical content.  Patient is alert and oriented at baseline.  Right eye is red Eye movement is intact   Assessment and Plan:   1. Infection of right eye  -  trimethoprim-polymyxin b (POLYTRIM) ophthalmic solution; Place 1 drop into the right eye in the morning, at noon, in the evening, and at bedtime.  Dispense: 10 mL; Refill: 0  -keep eye clean -avoid rubbing  -avoid contacts until improved -strict in person precautions reviewed    Reviewed side effects, risks and benefits of medication.    Patient acknowledged agreement and understanding of the plan.   Past Medical, Surgical, Social History, Allergies, and Medications have been Reviewed.     Follow Up Instructions: I discussed the assessment and treatment plan with the patient. The patient was provided an opportunity to ask questions and all were answered. The patient agreed with the plan and demonstrated an understanding of the instructions.  A copy of instructions were sent to the patient via MyChart unless otherwise noted below.    The patient was advised to call back or seek an in-person evaluation if the symptoms worsen or if the condition fails to improve as anticipated.  Time:  I spent 8 minutes with the patient via telehealth technology discussing the above problems/concerns.    Perlie Mayo, NP

## 2022-06-21 NOTE — Patient Instructions (Signed)
Wynell Balloon, thank you for joining Perlie Mayo, NP for today's virtual visit.  While this provider is not your primary care provider (PCP), if your PCP is located in our provider database this encounter information will be shared with them immediately following your visit.   [facility] account gives you access to today's visit and all your visits, tests, and labs performed at [facility] " click here if you don't have a [facility] account or go to [email or web address]  Consent: (Patient) Randy Brooks provided verbal consent for this virtual visit at the beginning of the encounter.  Current Medications:  Current Outpatient Medications:    trimethoprim-polymyxin b (POLYTRIM) ophthalmic solution, Place 1 drop into the right eye in the morning, at noon, in the evening, and at bedtime., Disp: 10 mL, Rfl: 0   EPINEPHrine 0.3 mg/0.3 mL IJ SOAJ injection, Inject 0.3 mLs (0.3 mg total) into the muscle as needed for anaphylaxis., Disp: 1 each, Rfl: 2   Medications ordered in this encounter:  Meds ordered this encounter  Medications   trimethoprim-polymyxin b (POLYTRIM) ophthalmic solution    Sig: Place 1 drop into the right eye in the morning, at noon, in the evening, and at bedtime.    Dispense:  10 mL    Refill:  0    Order Specific Question:   Supervising Provider    Answer:   Chase Picket [id number]     *If you need refills on other medications prior to your next appointment, please contact your pharmacy*  Follow-Up: Call back or seek an in-person evaluation if the symptoms worsen or if the condition fails to improve as anticipated.  [facility] [phone]  Other Instructions  Bacterial Conjunctivitis, Adult Bacterial conjunctivitis is an infection of your conjunctiva. This is the clear membrane that covers the white part of your eye and the inner part of your eyelid. This infection can make your eye: Red or pink. Itchy or  irritated. This condition spreads easily from person to person (is contagious) and from one eye to the other eye. What are the causes? This condition is caused by germs (bacteria). You may get the infection if you come into close contact with: A person who has the infection. Items that have germs on them (are contaminated), such as face towels, contact lens solution, or eye makeup. What increases the risk? You are more likely to get this condition if: You have contact with people who have the infection. You wear contact lenses. You have a sinus infection. You have had a recent eye injury or surgery. You have a weak body defense system (immune system). You have dry eyes. What are the signs or symptoms?  Thick, yellowish discharge from the eye. Tearing or watery eyes. Itchy eyes. Burning feeling in your eyes. Eye redness. Swollen eyelids. Blurred vision. How is this treated?  Antibiotic eye drops or ointment. Antibiotic medicine taken by mouth. This is used for infections that do not get better with drops or ointment or that last more than 10 days. Cool, wet cloths placed on the eyes. Artificial tears used 2-6 times a day. Follow these instructions at home: Medicines Take or apply your antibiotic medicine as told by your doctor. Do not stop using it even if you start to feel better. Take or apply over-the-counter and prescription medicines only as told by your doctor. Do not touch your eyelid with the eye-drop bottle or the ointment tube. Managing discomfort Wipe any fluid  from your eye with a warm, wet washcloth or a cotton ball. Place a clean, cool, wet cloth on your eye. Do this for 10-20 minutes, 3-4 times a day. General instructions Do not wear contacts until the infection is gone. Wear glasses until your doctor says it is okay to wear contacts again. Do not wear eye makeup until the infection is gone. Throw away old eye makeup. Change or wash your pillowcase every day. Do  not share towels or washcloths. Wash your hands often with soap and water for at least 20 seconds and especially before touching your face or eyes. Use paper towels to dry your hands. Do not touch or rub your eyes. Do not drive or use heavy machinery if your vision is blurred. Contact a doctor if: You have a fever. You do not get better after 10 days. Get help right away if: You have a fever and your symptoms get worse all of a sudden. You have very bad pain when you move your eye. Your face: Hurts. Is red. Is swollen. You have sudden loss of vision. Summary Bacterial conjunctivitis is an infection of your conjunctiva. This infection spreads easily from person to person. Wash your hands often with soap and water for at least 20 seconds and especially before touching your face or eyes. Use paper towels to dry your hands. Take or apply your antibiotic medicine as told by your doctor. Contact a doctor if you have a fever or you do not get better after 10 days. This information is not intended to replace advice given to you by your health care provider. Make sure you discuss any questions you have with your health care provider. Document Revised: 07/30/2020 Document Reviewed: 07/30/2020 Elsevier Patient Education  [place].    If you have been instructed to have an in-person evaluation today at a local Urgent Care facility, please use the link below. It will take you to a list of all of our available [facility], including address, phone number and hours of operation. Please do not delay care.  [facility]  If you or a family member do not have a primary care provider, use the link below to schedule a visit and establish care. When you choose a [facility] primary care physician or advanced practice provider, you gain a long-term partner in health. Find a Primary Care Provider  Learn more about [facility]'s in-office and virtual care options: [facility]

## 2024-04-13 ENCOUNTER — Emergency Department (HOSPITAL_COMMUNITY)
Admission: EM | Admit: 2024-04-13 | Discharge: 2024-04-14 | Disposition: A | Attending: Emergency Medicine | Admitting: Emergency Medicine

## 2024-04-13 ENCOUNTER — Emergency Department (HOSPITAL_COMMUNITY)

## 2024-04-13 ENCOUNTER — Encounter (HOSPITAL_COMMUNITY): Payer: Self-pay | Admitting: Emergency Medicine

## 2024-04-13 DIAGNOSIS — W108XXA Fall (on) (from) other stairs and steps, initial encounter: Secondary | ICD-10-CM | POA: Insufficient documentation

## 2024-04-13 DIAGNOSIS — S8011XA Contusion of right lower leg, initial encounter: Secondary | ICD-10-CM | POA: Insufficient documentation

## 2024-04-13 DIAGNOSIS — W19XXXA Unspecified fall, initial encounter: Secondary | ICD-10-CM

## 2024-04-13 DIAGNOSIS — R29898 Other symptoms and signs involving the musculoskeletal system: Secondary | ICD-10-CM

## 2024-04-13 DIAGNOSIS — R531 Weakness: Secondary | ICD-10-CM | POA: Insufficient documentation

## 2024-04-13 MED ORDER — OXYCODONE-ACETAMINOPHEN 5-325 MG PO TABS
2.0000 | ORAL_TABLET | Freq: Once | ORAL | Status: AC
  Administered 2024-04-14: 2 via ORAL
  Filled 2024-04-13: qty 2

## 2024-04-13 NOTE — ED Triage Notes (Signed)
 Pt coming in garage around 2200 and knees gave out and he fell. Did not hit head. Family state he was not himself. Had 2 beers earlier. EMS called for because he thought he was having a stroke. Neuro intact. Swelling to R shin. Fell onto steps.  When walking down steps with EMS, his knees seemed to buckle again a bit but did not fall.

## 2024-04-13 NOTE — ED Provider Notes (Incomplete)
°  [facility] DE[facility] AT [facility] Provider Note   CSN: [id number] Arrival date & time: 04/13/24  2324     History No chief complaint on file.   HPI Natalio Salois is a 43 y.o. male presenting for ***.   Patient's recorded medical, surgical, social, medication list and allergies were reviewed in the Snapshot window as part of the initial history.   Review of Systems   Review of Systems  Physical Exam Updated Vital Signs There were no vitals taken for this visit. Physical Exam   ED Course/ Medical Decision Making/ A&P    Procedures Procedures   Medications Ordered in ED Medications - No data to display  Medical Decision Making:   Rashaud Ybarbo is a 43 y.o. male who presented to the ED today with *** detailed above.    {crccomplexity:27900} Complete initial physical exam performed, notably the patient  was ***.    Reviewed and confirmed nursing documentation for past medical history, family history, social history.    Initial Assessment:   With the patient's presentation of ***, most likely diagnosis is ***. Other diagnoses were considered including (but not limited to) ***. These are considered less likely due to history of present illness and physical exam findings.   {crccopa:27899}  Initial Plan:  ***  ***Screening labs including CBC and Metabolic panel to evaluate for infectious or metabolic etiology of disease.  ***Urinalysis with reflex culture ordered to evaluate for UTI or relevant urologic/nephrologic pathology.  ***CXR to evaluate for structural/infectious intrathoracic pathology.  {crccardiactesting:32591::EKG to evaluate for cardiac pathology} Objective evaluation as below reviewed   Initial Study Results:   Laboratory  All laboratory results reviewed without evidence of clinically relevant pathology.   ***Exceptions include: ***   ***EKG EKG was reviewed independently. Rate, rhythm, axis, intervals all examined and without  medically relevant abnormality. ST segments without concerns for elevations.    Radiology:  All images reviewed independently. ***Agree with radiology report at this time.   No results found.    Consults: Case discussed with ***.   Reassessment and Plan:   ***    ***  Clinical Impression: No diagnosis found.   Data Unavailable   Final Clinical Impression(s) / ED Diagnoses Final diagnoses:  None    Rx / DC Orders ED Discharge Orders     None

## 2024-04-13 NOTE — ED Provider Notes (Signed)
 [place] [facility] [place] [facility] DEPARTMENT AT [facility] Provider Note   CSN: [id number] Arrival date & time: 04/13/24  2324     History Chief Complaint  Patient presents with   Leg Injury    HPI Randy Brooks is a 43 y.o. male presenting for chief complaint of fall event. Patient states that he was walking up the stairs and had sudden onset BL leg weakness. Family reported to EMS that he was slurring his words and not making sense but patient doesn't recall this.  Patient denies other symptoms today  Patient's recorded medical, surgical, social, medication list and allergies were reviewed in the Snapshot window as part of the initial history.   Review of Systems   Review of Systems  Constitutional:  Negative for chills and fever.  HENT:  Negative for ear pain and sore throat.   Eyes:  Negative for pain and visual disturbance.  Respiratory:  Negative for cough and shortness of breath.   Cardiovascular:  Negative for chest pain and palpitations.  Gastrointestinal:  Negative for abdominal pain and vomiting.  Genitourinary:  Negative for dysuria and hematuria.  Musculoskeletal:  Negative for arthralgias and back pain.  Skin:  Negative for color change and rash.  Neurological:  Positive for weakness. Negative for seizures and syncope.  All other systems reviewed and are negative.   Physical Exam Updated Vital Signs BP (!) 132/94 (BP Location: Right Arm)   Pulse 73   Temp 98.1 F (36.7 C) (Oral)   Resp (!) 9   SpO2 100%  Physical Exam Vitals and nursing note reviewed.  Constitutional:      General: He is not in acute distress.    Appearance: He is well-developed.  HENT:     Head: Normocephalic and atraumatic.  Eyes:     Conjunctiva/sclera: Conjunctivae normal.  Cardiovascular:     Rate and Rhythm: Normal rate and regular rhythm.     Heart sounds: No murmur heard. Pulmonary:     Effort: Pulmonary effort is normal. No respiratory distress.     Breath sounds: Normal  breath sounds.  Abdominal:     Palpations: Abdomen is soft.     Tenderness: There is no abdominal tenderness.  Musculoskeletal:        General: Signs of injury present. No swelling.     Cervical back: Neck supple.  Skin:    General: Skin is warm and dry.     Capillary Refill: Capillary refill takes less than 2 seconds.  Neurological:     Mental Status: He is alert.  Psychiatric:        Mood and Affect: Mood normal.      ED Course/ Medical Decision Making/ A&P    Procedures Procedures   Medications Ordered in ED Medications  oxyCODONE -acetaminophen  (PERCOCET/ROXICET) 5-325 MG per tablet 2 tablet (2 tablets Oral Given 04/14/24 0031)    Medical Decision Making:   This is a 43 year old male presenting with a chief complaint of episode of weakness and fall. He is a otherwise healthy male.  He does not recall the events of tonight very well but states he was going up the stairs and felt his legs give way falling into the stairs.  Family members came out and per EMS reported that he was confused and not making much sense so they called EMS.  By time of arrival here, patient denies any of this.  He denies any word slurring or difficulty and thinks his family overreacted He does have pretty substantial abrasions  to the bilateral shins right greater than left.  X-rays taken with no underlying orthopedic fracture. Patient stated mild alcohol intake but he does not recall exactly how much. Lab work shows blood alcohol of 340 and mild transaminitis consistent with likely binge drinking episode. Discussed this with the patient.  He is now in agreement with this history. CT head performed and shows no acute stroke or other pathology. Recommended keeping patient for MRI to evaluate for small vessel disease or other sign of stroke and patient initially was in agreement to wait.  However after 3 hours and 45 minutes, I received an update from MRI that there was still a likely 3 to 4-hour wait.   Discussed this with the patient he states he does not want to wait that long for an MRI.  He stated he would rather follow-up in the outpatient setting with neurology.  Given complete resolution of syndrome and likelihood of alternative pathology rather than small vessel disease I believe this is reasonable. Referral to neurology placed, patient reassessed with no neurologic symptoms at this time. Given his alcohol use, vitamin B12 or folate deficiency would be on the differential, recommended he start a daily Multimin vitamin and follow-up with neurology for further diagnostic care management  Disposition:  I have considered need for hospitalization, however, considering all of the above, I believe this patient is stable for discharge at this time.  Patient/family educated about specific return precautions for given chief complaint and symptoms.  Patient/family educated about follow-up with PCP.     Patient/family expressed understanding of return precautions and need for follow-up. Patient spoken to regarding all imaging and laboratory results and appropriate follow up for these results. All education provided in verbal form with additional information in written form. Time was allowed for answering of patient questions. Patient discharged.    Emergency Department Medication Summary:   Medications  oxyCODONE -acetaminophen  (PERCOCET/ROXICET) 5-325 MG per tablet 2 tablet (2 tablets Oral Given 04/14/24 0031)         Clinical Impression:  1. Contusion of right lower extremity, initial encounter   2. Leg weakness, bilateral   3. Fall, initial encounter      Discharge   Final Clinical Impression(s) / ED Diagnoses Final diagnoses:  Contusion of right lower extremity, initial encounter  Leg weakness, bilateral  Fall, initial encounter    Rx / DC Orders ED Discharge Orders     None         Jerral Meth, MD 04/14/24 [phone]

## 2024-04-14 ENCOUNTER — Emergency Department (HOSPITAL_COMMUNITY)

## 2024-04-14 LAB — COMPREHENSIVE METABOLIC PANEL WITH GFR
ALT: 100 U/L — ABNORMAL HIGH (ref 0–44)
AST: 178 U/L — ABNORMAL HIGH (ref 15–41)
Albumin: 4.1 g/dL (ref 3.5–5.0)
Alkaline Phosphatase: 103 U/L (ref 38–126)
Anion gap: 14 (ref 5–15)
BUN: <5 mg/dL — ABNORMAL LOW (ref 6–20)
CO2: 26 mmol/L (ref 22–32)
Calcium: 8.8 mg/dL — ABNORMAL LOW (ref 8.9–10.3)
Chloride: 99 mmol/L (ref 98–111)
Creatinine, Ser: 0.62 mg/dL (ref 0.61–1.24)
GFR, Estimated: >60 mL/min (ref >60)
Glucose, Bld: 100 mg/dL — ABNORMAL HIGH (ref 70–99)
Potassium: 3.9 mmol/L (ref 3.5–5.1)
Sodium: 139 mmol/L (ref 135–145)
Total Bilirubin: 1.4 mg/dL — ABNORMAL HIGH (ref 0.0–1.2)
Total Protein: 7 g/dL (ref 6.5–8.1)

## 2024-04-14 LAB — CBC WITH DIFFERENTIAL/PLATELET
Abs Immature Granulocytes: 0.03 K/uL (ref 0.00–0.07)
Basophils Absolute: 0 K/uL (ref 0.0–0.1)
Basophils Relative: 1 %
Eosinophils Absolute: 0 K/uL (ref 0.0–0.5)
Eosinophils Relative: 1 %
HCT: 42.8 % (ref 39.0–52.0)
Hemoglobin: 15.1 g/dL (ref 13.0–17.0)
Immature Granulocytes: 1 %
Lymphocytes Relative: 38 %
Lymphs Abs: 1.6 K/uL (ref 0.7–4.0)
MCH: 34.3 pg — ABNORMAL HIGH (ref 26.0–34.0)
MCHC: 35.3 g/dL (ref 30.0–36.0)
MCV: 97.3 fL (ref 80.0–100.0)
Monocytes Absolute: 0.4 K/uL (ref 0.1–1.0)
Monocytes Relative: 10 %
Neutro Abs: 2.1 K/uL (ref 1.7–7.7)
Neutrophils Relative %: 49 %
Platelets: 179 K/uL (ref 150–400)
RBC: 4.4 MIL/uL (ref 4.22–5.81)
RDW: 14.4 % (ref 11.5–15.5)
WBC: 4.3 K/uL (ref 4.0–10.5)
nRBC: 0 % (ref 0.0–0.2)

## 2024-04-14 LAB — ETHANOL: Alcohol, Ethyl (B): 340 mg/dL (ref <15)
